# Patient Record
Sex: Male | Born: 1975 | Race: White | Hispanic: No | Marital: Married | Smoking: Current every day smoker
Health system: Southern US, Community
[De-identification: ages and names within clinical notes are randomized; demographics above are authoritative.]

## PROBLEM LIST (undated history)

## (undated) DIAGNOSIS — S82302A Unspecified fracture of lower end of left tibia, initial encounter for closed fracture: Secondary | ICD-10-CM

## (undated) DIAGNOSIS — F17200 Nicotine dependence, unspecified, uncomplicated: Secondary | ICD-10-CM

## (undated) DIAGNOSIS — J449 Chronic obstructive pulmonary disease, unspecified: Secondary | ICD-10-CM

## (undated) DIAGNOSIS — M1990 Unspecified osteoarthritis, unspecified site: Secondary | ICD-10-CM

## (undated) HISTORY — PX: TOOTH EXTRACTION: SUR596

---

## 2016-08-07 DIAGNOSIS — R0602 Shortness of breath: Secondary | ICD-10-CM | POA: Diagnosis not present

## 2016-08-07 DIAGNOSIS — M1990 Unspecified osteoarthritis, unspecified site: Secondary | ICD-10-CM | POA: Diagnosis not present

## 2016-08-07 DIAGNOSIS — G43909 Migraine, unspecified, not intractable, without status migrainosus: Secondary | ICD-10-CM | POA: Diagnosis not present

## 2016-08-13 DIAGNOSIS — G43909 Migraine, unspecified, not intractable, without status migrainosus: Secondary | ICD-10-CM | POA: Diagnosis not present

## 2016-08-13 DIAGNOSIS — R51 Headache: Secondary | ICD-10-CM | POA: Diagnosis not present

## 2016-08-14 DIAGNOSIS — M129 Arthropathy, unspecified: Secondary | ICD-10-CM | POA: Diagnosis not present

## 2016-08-14 DIAGNOSIS — R0789 Other chest pain: Secondary | ICD-10-CM | POA: Diagnosis not present

## 2016-08-14 DIAGNOSIS — M542 Cervicalgia: Secondary | ICD-10-CM | POA: Diagnosis not present

## 2016-08-14 DIAGNOSIS — M545 Low back pain: Secondary | ICD-10-CM | POA: Diagnosis not present

## 2016-08-14 DIAGNOSIS — Z0000 Encounter for general adult medical examination without abnormal findings: Secondary | ICD-10-CM | POA: Diagnosis not present

## 2016-08-14 DIAGNOSIS — R5383 Other fatigue: Secondary | ICD-10-CM | POA: Diagnosis not present

## 2016-08-14 DIAGNOSIS — Z131 Encounter for screening for diabetes mellitus: Secondary | ICD-10-CM | POA: Diagnosis not present

## 2016-08-14 DIAGNOSIS — G8929 Other chronic pain: Secondary | ICD-10-CM | POA: Diagnosis not present

## 2016-08-14 DIAGNOSIS — J3089 Other allergic rhinitis: Secondary | ICD-10-CM | POA: Diagnosis not present

## 2016-08-28 DIAGNOSIS — M47816 Spondylosis without myelopathy or radiculopathy, lumbar region: Secondary | ICD-10-CM | POA: Diagnosis not present

## 2016-08-28 DIAGNOSIS — Z79899 Other long term (current) drug therapy: Secondary | ICD-10-CM | POA: Diagnosis not present

## 2016-08-28 DIAGNOSIS — M546 Pain in thoracic spine: Secondary | ICD-10-CM | POA: Diagnosis not present

## 2016-08-28 DIAGNOSIS — G8929 Other chronic pain: Secondary | ICD-10-CM | POA: Diagnosis not present

## 2016-09-11 DIAGNOSIS — G8929 Other chronic pain: Secondary | ICD-10-CM | POA: Diagnosis not present

## 2016-09-11 DIAGNOSIS — F17200 Nicotine dependence, unspecified, uncomplicated: Secondary | ICD-10-CM | POA: Diagnosis not present

## 2016-09-11 DIAGNOSIS — Z79899 Other long term (current) drug therapy: Secondary | ICD-10-CM | POA: Diagnosis not present

## 2016-09-11 DIAGNOSIS — M545 Low back pain: Secondary | ICD-10-CM | POA: Diagnosis not present

## 2016-09-11 DIAGNOSIS — Z87891 Personal history of nicotine dependence: Secondary | ICD-10-CM | POA: Diagnosis not present

## 2016-09-22 DIAGNOSIS — R091 Pleurisy: Secondary | ICD-10-CM | POA: Diagnosis not present

## 2016-09-22 DIAGNOSIS — M940 Chondrocostal junction syndrome [Tietze]: Secondary | ICD-10-CM | POA: Diagnosis not present

## 2016-09-22 DIAGNOSIS — R05 Cough: Secondary | ICD-10-CM | POA: Diagnosis not present

## 2016-09-22 DIAGNOSIS — R109 Unspecified abdominal pain: Secondary | ICD-10-CM | POA: Diagnosis not present

## 2016-09-25 DIAGNOSIS — L03211 Cellulitis of face: Secondary | ICD-10-CM | POA: Diagnosis not present

## 2016-09-25 DIAGNOSIS — J0100 Acute maxillary sinusitis, unspecified: Secondary | ICD-10-CM | POA: Diagnosis not present

## 2016-09-25 DIAGNOSIS — K047 Periapical abscess without sinus: Secondary | ICD-10-CM | POA: Diagnosis not present

## 2016-09-26 DIAGNOSIS — K029 Dental caries, unspecified: Secondary | ICD-10-CM | POA: Diagnosis not present

## 2016-09-26 DIAGNOSIS — K05219 Aggressive periodontitis, localized, unspecified severity: Secondary | ICD-10-CM | POA: Diagnosis not present

## 2016-09-26 DIAGNOSIS — K05319 Chronic periodontitis, localized, unspecified severity: Secondary | ICD-10-CM | POA: Diagnosis not present

## 2016-10-13 DIAGNOSIS — F17200 Nicotine dependence, unspecified, uncomplicated: Secondary | ICD-10-CM | POA: Diagnosis not present

## 2016-10-13 DIAGNOSIS — Z87891 Personal history of nicotine dependence: Secondary | ICD-10-CM | POA: Diagnosis not present

## 2016-10-13 DIAGNOSIS — Z79899 Other long term (current) drug therapy: Secondary | ICD-10-CM | POA: Diagnosis not present

## 2016-10-13 DIAGNOSIS — G8929 Other chronic pain: Secondary | ICD-10-CM | POA: Diagnosis not present

## 2016-10-13 DIAGNOSIS — M545 Low back pain: Secondary | ICD-10-CM | POA: Diagnosis not present

## 2016-10-13 DIAGNOSIS — G43909 Migraine, unspecified, not intractable, without status migrainosus: Secondary | ICD-10-CM | POA: Diagnosis not present

## 2016-11-12 DIAGNOSIS — Z79899 Other long term (current) drug therapy: Secondary | ICD-10-CM | POA: Diagnosis not present

## 2016-11-12 DIAGNOSIS — F1120 Opioid dependence, uncomplicated: Secondary | ICD-10-CM | POA: Diagnosis not present

## 2016-11-12 DIAGNOSIS — M545 Low back pain: Secondary | ICD-10-CM | POA: Diagnosis not present

## 2016-11-12 DIAGNOSIS — G8929 Other chronic pain: Secondary | ICD-10-CM | POA: Diagnosis not present

## 2016-11-12 DIAGNOSIS — Z716 Tobacco abuse counseling: Secondary | ICD-10-CM | POA: Diagnosis not present

## 2016-11-25 DIAGNOSIS — F17200 Nicotine dependence, unspecified, uncomplicated: Secondary | ICD-10-CM | POA: Diagnosis not present

## 2016-11-25 DIAGNOSIS — N179 Acute kidney failure, unspecified: Secondary | ICD-10-CM | POA: Diagnosis not present

## 2016-11-25 DIAGNOSIS — E860 Dehydration: Secondary | ICD-10-CM | POA: Diagnosis not present

## 2016-11-27 ENCOUNTER — Inpatient Hospital Stay (HOSPITAL_COMMUNITY)
Admission: EM | Admit: 2016-11-27 | Discharge: 2016-12-02 | DRG: 493 | Disposition: A | Discharge status: Home / Self Care | Payer: Worker's Compensation | Attending: Orthopedic Surgery | Admitting: Orthopedic Surgery

## 2016-11-27 ENCOUNTER — Emergency Department (HOSPITAL_COMMUNITY): Payer: Worker's Compensation

## 2016-11-27 ENCOUNTER — Encounter (HOSPITAL_COMMUNITY): Payer: Self-pay | Admitting: Emergency Medicine

## 2016-11-27 ENCOUNTER — Emergency Department (HOSPITAL_COMMUNITY): Payer: Worker's Compensation | Admitting: Anesthesiology

## 2016-11-27 ENCOUNTER — Inpatient Hospital Stay (HOSPITAL_COMMUNITY): Payer: Worker's Compensation

## 2016-11-27 ENCOUNTER — Encounter (HOSPITAL_COMMUNITY)
Admission: EM | Disposition: A | Discharge status: Home / Self Care | Payer: Self-pay | Source: Home / Self Care | Attending: Orthopedic Surgery

## 2016-11-27 DIAGNOSIS — J449 Chronic obstructive pulmonary disease, unspecified: Secondary | ICD-10-CM | POA: Diagnosis present

## 2016-11-27 DIAGNOSIS — Z79891 Long term (current) use of opiate analgesic: Secondary | ICD-10-CM

## 2016-11-27 DIAGNOSIS — S72402A Unspecified fracture of lower end of left femur, initial encounter for closed fracture: Secondary | ICD-10-CM | POA: Diagnosis present

## 2016-11-27 DIAGNOSIS — S9702XA Crushing injury of left ankle, initial encounter: Secondary | ICD-10-CM | POA: Diagnosis present

## 2016-11-27 DIAGNOSIS — Z419 Encounter for procedure for purposes other than remedying health state, unspecified: Secondary | ICD-10-CM

## 2016-11-27 DIAGNOSIS — M546 Pain in thoracic spine: Secondary | ICD-10-CM | POA: Diagnosis not present

## 2016-11-27 DIAGNOSIS — S82872A Displaced pilon fracture of left tibia, initial encounter for closed fracture: Secondary | ICD-10-CM | POA: Diagnosis present

## 2016-11-27 DIAGNOSIS — D62 Acute posthemorrhagic anemia: Secondary | ICD-10-CM | POA: Diagnosis not present

## 2016-11-27 DIAGNOSIS — M1990 Unspecified osteoarthritis, unspecified site: Secondary | ICD-10-CM | POA: Diagnosis present

## 2016-11-27 DIAGNOSIS — Z79899 Other long term (current) drug therapy: Secondary | ICD-10-CM | POA: Diagnosis not present

## 2016-11-27 DIAGNOSIS — F17200 Nicotine dependence, unspecified, uncomplicated: Secondary | ICD-10-CM | POA: Diagnosis present

## 2016-11-27 DIAGNOSIS — F17210 Nicotine dependence, cigarettes, uncomplicated: Secondary | ICD-10-CM | POA: Diagnosis present

## 2016-11-27 DIAGNOSIS — Y990 Civilian activity done for income or pay: Secondary | ICD-10-CM

## 2016-11-27 DIAGNOSIS — S82831A Other fracture of upper and lower end of right fibula, initial encounter for closed fracture: Secondary | ICD-10-CM

## 2016-11-27 DIAGNOSIS — W228XXA Striking against or struck by other objects, initial encounter: Secondary | ICD-10-CM | POA: Diagnosis present

## 2016-11-27 DIAGNOSIS — M549 Dorsalgia, unspecified: Secondary | ICD-10-CM | POA: Diagnosis present

## 2016-11-27 DIAGNOSIS — S82302A Unspecified fracture of lower end of left tibia, initial encounter for closed fracture: Secondary | ICD-10-CM

## 2016-11-27 DIAGNOSIS — T148XXA Other injury of unspecified body region, initial encounter: Secondary | ICD-10-CM

## 2016-11-27 DIAGNOSIS — S82832A Other fracture of upper and lower end of left fibula, initial encounter for closed fracture: Secondary | ICD-10-CM | POA: Diagnosis present

## 2016-11-27 DIAGNOSIS — S82301A Unspecified fracture of lower end of right tibia, initial encounter for closed fracture: Secondary | ICD-10-CM

## 2016-11-27 HISTORY — PX: EXTERNAL FIXATION LEG: SHX1549

## 2016-11-27 HISTORY — DX: Nicotine dependence, unspecified, uncomplicated: F17.200

## 2016-11-27 HISTORY — DX: Unspecified osteoarthritis, unspecified site: M19.90

## 2016-11-27 HISTORY — DX: Unspecified fracture of lower end of left tibia, initial encounter for closed fracture: S82.302A

## 2016-11-27 HISTORY — DX: Chronic obstructive pulmonary disease, unspecified: J44.9

## 2016-11-27 LAB — I-STAT CHEM 8, ED
BUN: 17 mg/dL (ref 6–20)
CALCIUM ION: 1.13 mmol/L — AB (ref 1.15–1.40)
CREATININE: 0.6 mg/dL — AB (ref 0.61–1.24)
Chloride: 106 mmol/L (ref 101–111)
GLUCOSE: 96 mg/dL (ref 65–99)
HCT: 40 % (ref 39.0–52.0)
HEMOGLOBIN: 13.6 g/dL (ref 13.0–17.0)
Potassium: 4.8 mmol/L (ref 3.5–5.1)
Sodium: 138 mmol/L (ref 135–145)
TCO2: 24 mmol/L (ref 0–100)

## 2016-11-27 LAB — CBC WITH DIFFERENTIAL/PLATELET
Basophils Absolute: 0 10*3/uL (ref 0.0–0.1)
Basophils Relative: 0 %
EOS PCT: 1 %
Eosinophils Absolute: 0.1 10*3/uL (ref 0.0–0.7)
HEMATOCRIT: 40.7 % (ref 39.0–52.0)
Hemoglobin: 14 g/dL (ref 13.0–17.0)
LYMPHS ABS: 1.5 10*3/uL (ref 0.7–4.0)
LYMPHS PCT: 21 %
MCH: 30 pg (ref 26.0–34.0)
MCHC: 34.4 g/dL (ref 30.0–36.0)
MCV: 87.3 fL (ref 78.0–100.0)
MONO ABS: 0.3 10*3/uL (ref 0.1–1.0)
MONOS PCT: 5 %
NEUTROS ABS: 5.1 10*3/uL (ref 1.7–7.7)
NEUTROS PCT: 73 %
PLATELETS: 151 10*3/uL (ref 150–400)
RBC: 4.66 MIL/uL (ref 4.22–5.81)
RDW: 13.8 % (ref 11.5–15.5)
WBC: 6.9 10*3/uL (ref 4.0–10.5)

## 2016-11-27 LAB — CBC
HEMATOCRIT: 37.7 % — AB (ref 39.0–52.0)
HEMOGLOBIN: 12.9 g/dL — AB (ref 13.0–17.0)
MCH: 29.9 pg (ref 26.0–34.0)
MCHC: 34.2 g/dL (ref 30.0–36.0)
MCV: 87.3 fL (ref 78.0–100.0)
Platelets: 141 10*3/uL — ABNORMAL LOW (ref 150–400)
RBC: 4.32 MIL/uL (ref 4.22–5.81)
RDW: 13.6 % (ref 11.5–15.5)
WBC: 10.8 10*3/uL — ABNORMAL HIGH (ref 4.0–10.5)

## 2016-11-27 LAB — CREATININE, SERUM
Creatinine, Ser: 0.86 mg/dL (ref 0.61–1.24)
GFR calc Af Amer: >60 mL/min (ref >60)
GFR calc non Af Amer: >60 mL/min (ref >60)

## 2016-11-27 SURGERY — EXTERNAL FIXATION, LOWER EXTREMITY
Anesthesia: General | Laterality: Left

## 2016-11-27 MED ORDER — MIDAZOLAM HCL 2 MG/2ML IJ SOLN
INTRAMUSCULAR | Status: AC
  Filled 2016-11-27: qty 2

## 2016-11-27 MED ORDER — PROMETHAZINE HCL 25 MG/ML IJ SOLN
6.2500 mg | INTRAMUSCULAR | Status: DC | PRN
  Administered 2016-11-27: 6.25 mg via INTRAVENOUS

## 2016-11-27 MED ORDER — OXYCODONE HCL 5 MG PO TABS: 5.0000 mg | ORAL_TABLET | Freq: Once | ORAL | Status: DC | PRN

## 2016-11-27 MED ORDER — HYDROMORPHONE HCL 1 MG/ML IJ SOLN
0.5000 mg | INTRAMUSCULAR | Status: AC | PRN
  Administered 2016-11-27 (×4): 0.5 mg via INTRAVENOUS

## 2016-11-27 MED ORDER — MIDAZOLAM HCL 2 MG/2ML IJ SOLN
INTRAMUSCULAR | Status: AC
Start: 2016-11-27 — End: 2016-11-27
  Administered 2016-11-27: 2 mg via INTRAVENOUS
  Filled 2016-11-27: qty 2

## 2016-11-27 MED ORDER — HYDROMORPHONE HCL 1 MG/ML IJ SOLN
INTRAMUSCULAR | Status: AC
  Administered 2016-11-27: 0.5 mg via INTRAVENOUS
  Filled 2016-11-27: qty 1

## 2016-11-27 MED ORDER — ACETAMINOPHEN 10 MG/ML IV SOLN
1000.0000 mg | Freq: Four times a day (QID) | INTRAVENOUS | Status: DC
  Administered 2016-11-27 – 2016-11-28 (×3): 1000 mg via INTRAVENOUS
  Filled 2016-11-27 (×2): qty 100

## 2016-11-27 MED ORDER — HYDROMORPHONE HCL 1 MG/ML IJ SOLN
1.0000 mg | Freq: Once | INTRAMUSCULAR | Status: AC
  Administered 2016-11-27: 1 mg via INTRAVENOUS
  Filled 2016-11-27: qty 1

## 2016-11-27 MED ORDER — PROPOFOL 10 MG/ML IV BOLUS
INTRAVENOUS | Status: AC
  Filled 2016-11-27: qty 20

## 2016-11-27 MED ORDER — SUCCINYLCHOLINE CHLORIDE 200 MG/10ML IV SOSY
PREFILLED_SYRINGE | INTRAVENOUS | Status: AC
  Filled 2016-11-27: qty 10

## 2016-11-27 MED ORDER — MIDAZOLAM HCL 2 MG/2ML IJ SOLN
0.2500 mg | Freq: Once | INTRAMUSCULAR | Status: AC
  Administered 2016-11-27: 2 mg via INTRAVENOUS

## 2016-11-27 MED ORDER — FENTANYL CITRATE (PF) 100 MCG/2ML IJ SOLN
INTRAMUSCULAR | Status: DC | PRN
  Administered 2016-11-27: 50 ug via INTRAVENOUS
  Administered 2016-11-27: 150 ug via INTRAVENOUS
  Administered 2016-11-27 (×2): 50 ug via INTRAVENOUS

## 2016-11-27 MED ORDER — ENOXAPARIN SODIUM 40 MG/0.4ML ~~LOC~~ SOLN
40.0000 mg | SUBCUTANEOUS | Status: DC
  Administered 2016-11-28 – 2016-12-02 (×5): 40 mg via SUBCUTANEOUS
  Filled 2016-11-27 (×5): qty 0.4

## 2016-11-27 MED ORDER — OXYCODONE HCL 5 MG/5ML PO SOLN: 5.0000 mg | Freq: Once | ORAL | Status: DC | PRN

## 2016-11-27 MED ORDER — FENTANYL CITRATE (PF) 100 MCG/2ML IJ SOLN
INTRAMUSCULAR | Status: AC
  Filled 2016-11-27: qty 2

## 2016-11-27 MED ORDER — HYDROMORPHONE HCL 1 MG/ML IJ SOLN
1.0000 mg | INTRAMUSCULAR | Status: DC | PRN
Start: 2016-11-27 — End: 2016-11-27
  Administered 2016-11-27: 1 mg via INTRAVENOUS
  Filled 2016-11-27: qty 1

## 2016-11-27 MED ORDER — PROMETHAZINE HCL 25 MG/ML IJ SOLN
INTRAMUSCULAR | Status: AC
  Administered 2016-11-27: 6.25 mg via INTRAVENOUS
  Filled 2016-11-27: qty 1

## 2016-11-27 MED ORDER — LIDOCAINE HCL (CARDIAC) 20 MG/ML IV SOLN
INTRAVENOUS | Status: DC | PRN
  Administered 2016-11-27: 100 mg via INTRAVENOUS

## 2016-11-27 MED ORDER — KETAMINE HCL-SODIUM CHLORIDE 100-0.9 MG/10ML-% IV SOSY
PREFILLED_SYRINGE | INTRAVENOUS | Status: AC
  Filled 2016-11-27: qty 10

## 2016-11-27 MED ORDER — ONDANSETRON HCL 4 MG/2ML IJ SOLN
INTRAMUSCULAR | Status: AC
  Filled 2016-11-27: qty 2

## 2016-11-27 MED ORDER — ONDANSETRON HCL 4 MG/2ML IJ SOLN
INTRAMUSCULAR | Status: DC | PRN
  Administered 2016-11-27: 4 mg via INTRAVENOUS

## 2016-11-27 MED ORDER — KETAMINE HCL 10 MG/ML IJ SOLN
INTRAMUSCULAR | Status: DC | PRN
  Administered 2016-11-27: 50 mg via INTRAVENOUS

## 2016-11-27 MED ORDER — METHOCARBAMOL 500 MG PO TABS
ORAL_TABLET | ORAL | Status: AC
  Administered 2016-11-27: 1000 mg via ORAL
  Filled 2016-11-27: qty 1

## 2016-11-27 MED ORDER — FENTANYL CITRATE (PF) 250 MCG/5ML IJ SOLN
INTRAMUSCULAR | Status: AC
  Filled 2016-11-27: qty 5

## 2016-11-27 MED ORDER — ACETAMINOPHEN 10 MG/ML IV SOLN
INTRAVENOUS | Status: AC
  Administered 2016-11-27: 1000 mg via INTRAVENOUS
  Filled 2016-11-27: qty 100

## 2016-11-27 MED ORDER — HYDROMORPHONE HCL 1 MG/ML IJ SOLN
0.5000 mg | INTRAMUSCULAR | Status: DC | PRN
  Administered 2016-11-27 – 2016-11-28 (×7): 1 mg via INTRAVENOUS
  Filled 2016-11-27 (×7): qty 1

## 2016-11-27 MED ORDER — SUGAMMADEX SODIUM 200 MG/2ML IV SOLN
INTRAVENOUS | Status: AC
  Filled 2016-11-27: qty 2

## 2016-11-27 MED ORDER — CEFAZOLIN SODIUM-DEXTROSE 1-4 GM/50ML-% IV SOLN
1.0000 g | Freq: Four times a day (QID) | INTRAVENOUS | Status: AC
  Administered 2016-11-27 – 2016-11-28 (×3): 1 g via INTRAVENOUS
  Filled 2016-11-27 (×3): qty 50

## 2016-11-27 MED ORDER — METHOCARBAMOL 1000 MG/10ML IJ SOLN
1000.0000 mg | Freq: Four times a day (QID) | INTRAVENOUS | Status: DC
  Filled 2016-11-27 (×22): qty 10

## 2016-11-27 MED ORDER — ONDANSETRON HCL 4 MG PO TABS: 4.0000 mg | ORAL_TABLET | Freq: Four times a day (QID) | ORAL | Status: DC | PRN

## 2016-11-27 MED ORDER — METOCLOPRAMIDE HCL 5 MG PO TABS: 5.0000 mg | ORAL_TABLET | Freq: Three times a day (TID) | ORAL | Status: DC | PRN

## 2016-11-27 MED ORDER — MEPERIDINE HCL 25 MG/ML IJ SOLN
6.2500 mg | INTRAMUSCULAR | Status: DC | PRN
Start: 2016-11-27 — End: 2016-11-27

## 2016-11-27 MED ORDER — METOCLOPRAMIDE HCL 5 MG/ML IJ SOLN: 5.0000 mg | Freq: Three times a day (TID) | INTRAMUSCULAR | Status: DC | PRN

## 2016-11-27 MED ORDER — MIDAZOLAM HCL 5 MG/5ML IJ SOLN
INTRAMUSCULAR | Status: DC | PRN
  Administered 2016-11-27: 2 mg via INTRAVENOUS

## 2016-11-27 MED ORDER — 0.9 % SODIUM CHLORIDE (POUR BTL) OPTIME
TOPICAL | Status: DC | PRN
  Administered 2016-11-27: 1000 mL

## 2016-11-27 MED ORDER — ROCURONIUM BROMIDE 100 MG/10ML IV SOLN
INTRAVENOUS | Status: DC | PRN
  Administered 2016-11-27 (×2): 10 mg via INTRAVENOUS
  Administered 2016-11-27: 50 mg via INTRAVENOUS

## 2016-11-27 MED ORDER — CEFAZOLIN SODIUM-DEXTROSE 2-3 GM-% IV SOLR
INTRAVENOUS | Status: DC | PRN
  Administered 2016-11-27: 2 g via INTRAVENOUS

## 2016-11-27 MED ORDER — OXYCODONE HCL 5 MG PO TABS
5.0000 mg | ORAL_TABLET | ORAL | Status: DC | PRN
  Administered 2016-11-27 (×2): 15 mg via ORAL
  Administered 2016-11-27: 10 mg via ORAL
  Administered 2016-11-28 (×3): 15 mg via ORAL
  Filled 2016-11-27 (×6): qty 3

## 2016-11-27 MED ORDER — ONDANSETRON HCL 4 MG/2ML IJ SOLN: 4.0000 mg | Freq: Four times a day (QID) | INTRAMUSCULAR | Status: DC | PRN

## 2016-11-27 MED ORDER — METHOCARBAMOL 500 MG PO TABS
1000.0000 mg | ORAL_TABLET | Freq: Four times a day (QID) | ORAL | Status: DC
  Administered 2016-11-27 – 2016-12-02 (×21): 1000 mg via ORAL
  Filled 2016-11-27 (×20): qty 2

## 2016-11-27 MED ORDER — PROPOFOL 10 MG/ML IV BOLUS
INTRAVENOUS | Status: DC | PRN
  Administered 2016-11-27: 200 mg via INTRAVENOUS
  Administered 2016-11-27 (×2): 50 mg via INTRAVENOUS

## 2016-11-27 MED ORDER — GABAPENTIN 300 MG PO CAPS
300.0000 mg | ORAL_CAPSULE | Freq: Three times a day (TID) | ORAL | Status: DC
  Administered 2016-11-28 – 2016-12-01 (×9): 300 mg via ORAL
  Filled 2016-11-27 (×10): qty 1

## 2016-11-27 MED ORDER — SUCCINYLCHOLINE CHLORIDE 20 MG/ML IJ SOLN
INTRAMUSCULAR | Status: DC | PRN
  Administered 2016-11-27: 100 mg via INTRAVENOUS

## 2016-11-27 MED ORDER — ALBUTEROL SULFATE (2.5 MG/3ML) 0.083% IN NEBU: 2.5000 mg | INHALATION_SOLUTION | Freq: Four times a day (QID) | RESPIRATORY_TRACT | Status: DC | PRN

## 2016-11-27 MED ORDER — METHOCARBAMOL 500 MG PO TABS
ORAL_TABLET | ORAL | Status: AC
  Filled 2016-11-27: qty 1

## 2016-11-27 MED ORDER — LIDOCAINE 2% (20 MG/ML) 5 ML SYRINGE
INTRAMUSCULAR | Status: AC
  Filled 2016-11-27: qty 5

## 2016-11-27 MED ORDER — LACTATED RINGERS IV SOLN
INTRAVENOUS | Status: DC
  Administered 2016-11-27 (×3): via INTRAVENOUS

## 2016-11-27 MED ORDER — OXYCODONE HCL 5 MG PO TABS
ORAL_TABLET | ORAL | Status: AC
  Administered 2016-11-27: 10 mg via ORAL
  Filled 2016-11-27: qty 2

## 2016-11-27 MED ORDER — POTASSIUM CHLORIDE IN NACL 20-0.9 MEQ/L-% IV SOLN
INTRAVENOUS | Status: DC
  Administered 2016-11-28: 03:00:00 via INTRAVENOUS
  Filled 2016-11-27 (×2): qty 1000

## 2016-11-27 MED ORDER — SUGAMMADEX SODIUM 200 MG/2ML IV SOLN
INTRAVENOUS | Status: DC | PRN
  Administered 2016-11-27: 200 mg via INTRAVENOUS

## 2016-11-27 MED ORDER — HYDROMORPHONE HCL 1 MG/ML IJ SOLN
0.2500 mg | INTRAMUSCULAR | Status: DC | PRN
  Administered 2016-11-27 (×4): 0.5 mg via INTRAVENOUS

## 2016-11-27 MED ORDER — ROCURONIUM BROMIDE 10 MG/ML (PF) SYRINGE
PREFILLED_SYRINGE | INTRAVENOUS | Status: AC
  Filled 2016-11-27: qty 5

## 2016-11-27 SURGICAL SUPPLY — 87 items
BANDAGE ACE 4X5 VEL STRL LF (GAUZE/BANDAGES/DRESSINGS) ×2 IMPLANT
BANDAGE ACE 6X5 VEL STRL LF (GAUZE/BANDAGES/DRESSINGS) ×2 IMPLANT
BANDAGE ESMARK 6X9 LF (GAUZE/BANDAGES/DRESSINGS) ×1 IMPLANT
BAR GLASS FIBER EXFX 11X150 (EXFIX) ×4 IMPLANT
BAR GLASS FIBER EXFX 11X350 (EXFIX) ×4 IMPLANT
BIT DRILL 3.5 (BIT) ×1
BIT DRILL 3.5MM (BIT) ×1 IMPLANT
BIT DRILL WIN 4.0 (BIT) ×2 IMPLANT
BNDG COHESIVE 2X5 WHT NS (GAUZE/BANDAGES/DRESSINGS) ×2 IMPLANT
BNDG COHESIVE 3X5 WHT NS (GAUZE/BANDAGES/DRESSINGS) ×2 IMPLANT
BNDG COHESIVE 4X5 TAN STRL (GAUZE/BANDAGES/DRESSINGS) ×2 IMPLANT
BNDG COHESIVE 6X5 TAN STRL LF (GAUZE/BANDAGES/DRESSINGS) IMPLANT
BNDG ESMARK 6X9 LF (GAUZE/BANDAGES/DRESSINGS) ×2
BNDG GAUZE ELAST 4 BULKY (GAUZE/BANDAGES/DRESSINGS) ×4 IMPLANT
BRUSH SCRUB SURG 4.25 DISP (MISCELLANEOUS) ×6 IMPLANT
CANISTER WOUND CARE 500ML ATS (WOUND CARE) IMPLANT
CLAMP BLUE BAR TO BAR (EXFIX) ×4 IMPLANT
CLAMP BLUE BAR TO PIN (EXFIX) ×10 IMPLANT
COVER SURGICAL LIGHT HANDLE (MISCELLANEOUS) ×2 IMPLANT
CUFF TOURNIQUET SINGLE 18IN (TOURNIQUET CUFF) IMPLANT
CUFF TOURNIQUET SINGLE 24IN (TOURNIQUET CUFF) IMPLANT
CUFF TOURNIQUET SINGLE 34IN LL (TOURNIQUET CUFF) IMPLANT
DRAPE C-ARM 42X72 X-RAY (DRAPES) ×2 IMPLANT
DRAPE C-ARMOR (DRAPES) ×2 IMPLANT
DRAPE INCISE IOBAN 66X45 STRL (DRAPES) ×2 IMPLANT
DRAPE ORTHO SPLIT 77X108 STRL (DRAPES) ×2
DRAPE SURG ORHT 6 SPLT 77X108 (DRAPES) ×2 IMPLANT
DRAPE U-SHAPE 47X51 STRL (DRAPES) ×2 IMPLANT
DRILL BIT 3.5MM (BIT) ×1
DRSG ADAPTIC 3X8 NADH LF (GAUZE/BANDAGES/DRESSINGS) ×2 IMPLANT
DRSG MEPILEX BORDER 4X8 (GAUZE/BANDAGES/DRESSINGS) ×2 IMPLANT
DRSG MEPITEL 4X7.2 (GAUZE/BANDAGES/DRESSINGS) IMPLANT
DRSG PAD ABDOMINAL 8X10 ST (GAUZE/BANDAGES/DRESSINGS) ×2 IMPLANT
DRSG VAC ATS LRG SENSATRAC (GAUZE/BANDAGES/DRESSINGS) IMPLANT
DRSG VAC ATS MED SENSATRAC (GAUZE/BANDAGES/DRESSINGS) IMPLANT
DRSG VAC ATS SM SENSATRAC (GAUZE/BANDAGES/DRESSINGS) IMPLANT
ELECT REM PT RETURN 9FT ADLT (ELECTROSURGICAL) ×2
ELECTRODE REM PT RTRN 9FT ADLT (ELECTROSURGICAL) ×1 IMPLANT
EVACUATOR 1/8 PVC DRAIN (DRAIN) IMPLANT
GAUZE SPONGE 4X4 12PLY STRL (GAUZE/BANDAGES/DRESSINGS) ×2 IMPLANT
GAUZE SPONGE 4X4 12PLY STRL LF (GAUZE/BANDAGES/DRESSINGS) ×2 IMPLANT
GLOVE BIO SURGEON STRL SZ7.5 (GLOVE) ×2 IMPLANT
GLOVE BIO SURGEON STRL SZ8 (GLOVE) ×2 IMPLANT
GLOVE BIOGEL PI IND STRL 7.5 (GLOVE) ×1 IMPLANT
GLOVE BIOGEL PI IND STRL 8 (GLOVE) ×1 IMPLANT
GLOVE BIOGEL PI INDICATOR 7.5 (GLOVE) ×1
GLOVE BIOGEL PI INDICATOR 8 (GLOVE) ×1
GLOVE XGUARD RR 2 7.5 (GLOVE) ×1 IMPLANT
GLOVE XGUARD RR2 7.5 (GLOVE) ×1
GOWN STRL REUS W/ TWL LRG LVL3 (GOWN DISPOSABLE) ×2 IMPLANT
GOWN STRL REUS W/ TWL XL LVL3 (GOWN DISPOSABLE) ×1 IMPLANT
GOWN STRL REUS W/TWL LRG LVL3 (GOWN DISPOSABLE) ×2
GOWN STRL REUS W/TWL XL LVL3 (GOWN DISPOSABLE) ×1
HALF PIN 3MM (EXFIX) ×2 IMPLANT
KIT BASIN OR (CUSTOM PROCEDURE TRAY) ×2 IMPLANT
KIT ROOM TURNOVER OR (KITS) ×2 IMPLANT
MANIFOLD NEPTUNE II (INSTRUMENTS) IMPLANT
NAIL FLEX WIN 3.0MM (Nail) ×2 IMPLANT
NAIL FLEXIBLE WIN 3.5MM (Nail) ×2 IMPLANT
NEEDLE 22X1 1/2 (OR ONLY) (NEEDLE) IMPLANT
NS IRRIG 1000ML POUR BTL (IV SOLUTION) ×2 IMPLANT
PACK GENERAL/GYN (CUSTOM PROCEDURE TRAY) IMPLANT
PACK ORTHO EXTREMITY (CUSTOM PROCEDURE TRAY) ×2 IMPLANT
PAD ARMBOARD 7.5X6 YLW CONV (MISCELLANEOUS) ×4 IMPLANT
PAD CAST 3X4 CTTN HI CHSV (CAST SUPPLIES) ×1 IMPLANT
PADDING CAST COTTON 3X4 STRL (CAST SUPPLIES) ×1
PADDING CAST COTTON 6X4 STRL (CAST SUPPLIES) ×4 IMPLANT
PIN 3MM (EXFIX) ×2 IMPLANT
PIN CLAMP 2BAR 75MM BLUE (EXFIX) ×2 IMPLANT
PIN HALF YELLOW 5X160X35 (EXFIX) ×4 IMPLANT
PIN TRANSFIXING 5.0 (EXFIX) ×2 IMPLANT
SCOTCHCAST PLUS 2X4 WHITE (CAST SUPPLIES) ×2 IMPLANT
SET MONITOR QUICK PRESSURE (MISCELLANEOUS) IMPLANT
SPONGE LAP 18X18 X RAY DECT (DISPOSABLE) ×2 IMPLANT
STAPLER VISISTAT 35W (STAPLE) IMPLANT
STOCKINETTE IMPERVIOUS 9X36 MD (GAUZE/BANDAGES/DRESSINGS) IMPLANT
STOCKINETTE IMPERVIOUS LG (DRAPES) IMPLANT
STRIP CLOSURE SKIN 1/2X4 (GAUZE/BANDAGES/DRESSINGS) IMPLANT
SUT ETHILON 2 0 FSLX (SUTURE) IMPLANT
SUT ETHILON 3 0 PS 1 (SUTURE) ×2 IMPLANT
SUT VIC AB 0 CTB1 27 (SUTURE) IMPLANT
SUT VIC AB 2-0 CT3 27 (SUTURE) IMPLANT
SUT VIC AB 2-0 CTB1 (SUTURE) IMPLANT
TOWEL OR 17X24 6PK STRL BLUE (TOWEL DISPOSABLE) ×2 IMPLANT
TOWEL OR 17X26 10 PK STRL BLUE (TOWEL DISPOSABLE) ×4 IMPLANT
UNDERPAD 30X30 (UNDERPADS AND DIAPERS) ×2 IMPLANT
WATER STERILE IRR 1000ML POUR (IV SOLUTION) ×2 IMPLANT

## 2016-11-27 NOTE — Anesthesia Preprocedure Evaluation (Addendum)
Anesthesia Evaluation  Patient identified by MRN, date of birth, ID band Patient awake    Reviewed: Allergy & Precautions, NPO status , Patient's Chart, lab work & pertinent test results  Airway Mallampati: II  TM Distance: >3 FB Neck ROM: Full    Dental no notable dental hx. (+) Poor Dentition, Missing, Chipped   Pulmonary COPD, Current Smoker,    Pulmonary exam normal breath sounds clear to auscultation       Cardiovascular negative cardio ROS Normal cardiovascular exam Rhythm:Regular Rate:Normal     Neuro/Psych negative neurological ROS  negative psych ROS   GI/Hepatic negative GI ROS, Neg liver ROS,   Endo/Other  negative endocrine ROS  Renal/GU negative Renal ROS     Musculoskeletal negative musculoskeletal ROS (+)   Abdominal   Peds  Hematology negative hematology ROS (+)   Anesthesia Other Findings   Reproductive/Obstetrics negative OB ROS                             Anesthesia Physical Anesthesia Plan  ASA: II  Anesthesia Plan: General   Post-op Pain Management:    Induction: Intravenous  PONV Risk Score and Plan: 2 and Ondansetron and Dexamethasone  Airway Management Planned: Oral ETT  Additional Equipment:   Intra-op Plan:   Post-operative Plan: Extubation in OR  Informed Consent: I have reviewed the patients History and Physical, chart, labs and discussed the procedure including the risks, benefits and alternatives for the proposed anesthesia with the patient or authorized representative who has indicated his/her understanding and acceptance.   Dental advisory given  Plan Discussed with: CRNA  Anesthesia Plan Comments:        Anesthesia Quick Evaluation

## 2016-11-27 NOTE — ED Notes (Signed)
ED Provider at bedside. 

## 2016-11-27 NOTE — Anesthesia Postprocedure Evaluation (Signed)
Anesthesia Post Note  Patient: Oscar Martinez  Procedure(s) Performed: Procedure(s) (LRB): EXTERNAL FIXATION POSSIBLE REPAIR OF LEFT TIBIA AND FIBULA FRACTURES (Left)     Patient location during evaluation: PACU Anesthesia Type: General Level of consciousness: awake and alert and oriented Pain management: pain level controlled Vital Signs Assessment: post-procedure vital signs reviewed and stable Respiratory status: spontaneous breathing, nonlabored ventilation and respiratory function stable Cardiovascular status: blood pressure returned to baseline and stable Postop Assessment: no signs of nausea or vomiting Anesthetic complications: no    Last Vitals:  Vitals:   11/27/16 1215 11/27/16 1230  BP: 106/83 114/80  Pulse: 81 79  Resp: 12 15  Temp:    SpO2: 99% 100%    Last Pain:  Vitals:   11/27/16 1231  TempSrc:   PainSc: 8                  Ettore Trebilcock A.

## 2016-11-27 NOTE — Anesthesia Procedure Notes (Signed)
Procedure Name: Intubation Date/Time: 11/27/2016 1:48 PM Performed by: Fransisca KaufmannMEYER, Skya Mccullum E Pre-anesthesia Checklist: Patient identified, Emergency Drugs available, Suction available and Patient being monitored Patient Re-evaluated:Patient Re-evaluated prior to induction Oxygen Delivery Method: Circle System Utilized Preoxygenation: Pre-oxygenation with 100% oxygen Induction Type: IV induction and Rapid sequence Ventilation: Mask ventilation without difficulty Laryngoscope Size: Miller and 3 Grade View: Grade I Tube type: Oral Tube size: 7.5 mm Number of attempts: 1 Airway Equipment and Method: Stylet and Oral airway Placement Confirmation: ETT inserted through vocal cords under direct vision,  positive ETCO2 and breath sounds checked- equal and bilateral Secured at: 23 cm Tube secured with: Tape Dental Injury: Teeth and Oropharynx as per pre-operative assessment

## 2016-11-27 NOTE — ED Notes (Signed)
Dr.Handy at bedside 

## 2016-11-27 NOTE — H&P (Signed)
[facility] H&P/Consult     Patient ID: Oscar Martinez MRN: [id number] DOB/AGE: 41-Dec-1977 41 y.o.  Chief Complaint: Left tibia and fibula fracture HPI: Oscar SomCalvin Martinez is an 41 y.o. male.who has been on Norco 7.5 for two months for back and leg arthritis from Dr[facility] K[facility]ernando [facility], two days ago IVF at [facility] for dehydration with elevated creatinine, and after taking yesterday off went to work today where a full size log rolled off the truck, hitting him the head hard enough (per his report) to actually drive his leg into the ground and fracture his ankle. Denies any direct impact of log to leg. Denies numbness or other injury. Denies neckk and back pain but states that they are starting to feel a little more sore now.  CT scan negative for skull fracture, cervical fracture, or soft tissue swelling.   Past Medical History:  Diagnosis Date  . Arthritis   . COPD (chronic obstructive pulmonary disease) (HCC)     History reviewed. No pertinent surgical history.  History reviewed. No pertinent family history. Social History:  reports that he has been smoking.  He has been smoking about 1.50 packs per day. He has never used smokeless tobacco. He reports that he does not drink alcohol or use drugs. Drinks [employer] and [employer].  Allergies: No Known Allergies   (Not in a hospital admission)  Results for orders placed or performed during the hospital encounter of 11/27/16 (from the past 48 hour(s))  CBC with Differential/Platelet     Status: None   Collection Time: 11/27/16 10:15 AM  Result Value Ref Range   WBC 6.9 4.0 - 10.5 K/uL   RBC 4.66 4.22 - 5.81 MIL/uL   Hemoglobin 14.0 13.0 - 17.0 g/dL   HCT 40.940.7 81.139.0 - 91.452.0 %   MCV 87.3 78.0 - 100.0 fL   MCH 30.0 26.0 - 34.0 pg   MCHC 34.4 30.0 - 36.0 g/dL   RDW 78.213.8 95.611.5 - 21.315.5 %   Platelets 151 150 - 400 K/uL   Neutrophils Relative % 73 %   Neutro Abs 5.1 1.7 - 7.7 K/uL   Lymphocytes  Relative 21 %   Lymphs Abs 1.5 0.7 - 4.0 K/uL   Monocytes Relative 5 %   Monocytes Absolute 0.3 0.1 - 1.0 K/uL   Eosinophils Relative 1 %   Eosinophils Absolute 0.1 0.0 - 0.7 K/uL   Basophils Relative 0 %   Basophils Absolute 0.0 0.0 - 0.1 K/uL  I-stat chem 8, ed     Status: Abnormal   Collection Time: 11/27/16 11:49 AM  Result Value Ref Range   Sodium 138 135 - 145 mmol/L   Potassium 4.8 3.5 - 5.1 mmol/L   Chloride 106 101 - 111 mmol/L   BUN 17 6 - 20 mg/dL   Creatinine, Ser 0.860.60 (L) 0.61 - 1.24 mg/dL   Glucose, Bld 96 65 - 99 mg/dL   Calcium, Ion 5.781.13 (L) 1.15 - 1.40 mmol/L   TCO2 24 0 - 100 mmol/L   Hemoglobin 13.6 13.0 - 17.0 g/dL   HCT 46.940.0 62.939.0 - 52.852.0 %   Dg Ankle Complete Left  Result Date: 11/27/2016 CLINICAL DATA:  Left ankle injury today EXAM: LEFT ANKLE COMPLETE - 3+ VIEW COMPARISON:  None. FINDINGS: Comminuted distal left tibia fracture involving the distal metaphysis and apparently extending distally to the articular surface at the posterior malleolus, with 8 mm lateral displacement of the dominant distal fracture fragment  and mild apex anterior angulation. Comminuted non articular fracture of the distal metadiaphysis in the left fibula, with one shaft's with lateral displacement of the dominant distal fracture fragment and mild apex anterior angulation. No additional fractures. No subluxation. No suspicious focal osseous lesion. No radiopaque foreign body. IMPRESSION: 1. Comminuted displaced distal left tibia fracture as detailed, noting probable intra-articular extension at the posterior malleolus. 2. Comminuted non articular distal left fibula fracture as detailed. Electronically Signed   By: Delbert Phenix M.D.   On: 11/27/2016 10:44   Ct Head Wo Contrast  Result Date: 11/27/2016 CLINICAL DATA:  Posterior neck pain and headache after being hit in the head with a log. EXAM: CT HEAD WITHOUT CONTRAST CT CERVICAL SPINE WITHOUT CONTRAST TECHNIQUE: Multidetector CT imaging of the  head and cervical spine was performed following the standard protocol without intravenous contrast. Multiplanar CT image reconstructions of the cervical spine were also generated. COMPARISON:  None. FINDINGS: CT HEAD FINDINGS Brain: No evidence of acute infarction, hemorrhage, hydrocephalus, extra-axial collection or mass lesion/mass effect. Vascular: No hyperdense vessel or unexpected calcification. Skull: Normal. Negative for fracture or focal lesion. Sinuses/Orbits: Moderate mucosal thickening in the right maxillary sinus. Small mucous retention cysts in the bilateral sphenoid sinuses. The remaining paranasal sinuses and mastoid air cells are clear. The orbits are unremarkable. Other: None. CT CERVICAL SPINE FINDINGS Alignment: Normal. Skull base and vertebrae: No acute fracture. No primary bone lesion or focal pathologic process. Soft tissues and spinal canal: No prevertebral fluid or swelling. No visible canal hematoma. Disc levels:  Scattered minimal degenerative changes. Upper chest: Negative. Other: None. IMPRESSION: 1.  No acute intracranial abnormality. 2.  No acute cervical spine fracture. Electronically Signed   By: Obie Dredge M.D.   On: 11/27/2016 11:06   Ct Cervical Spine Wo Contrast  Result Date: 11/27/2016 CLINICAL DATA:  Posterior neck pain and headache after being hit in the head with a log. EXAM: CT HEAD WITHOUT CONTRAST CT CERVICAL SPINE WITHOUT CONTRAST TECHNIQUE: Multidetector CT imaging of the head and cervical spine was performed following the standard protocol without intravenous contrast. Multiplanar CT image reconstructions of the cervical spine were also generated. COMPARISON:  None. FINDINGS: CT HEAD FINDINGS Brain: No evidence of acute infarction, hemorrhage, hydrocephalus, extra-axial collection or mass lesion/mass effect. Vascular: No hyperdense vessel or unexpected calcification. Skull: Normal. Negative for fracture or focal lesion. Sinuses/Orbits: Moderate mucosal thickening  in the right maxillary sinus. Small mucous retention cysts in the bilateral sphenoid sinuses. The remaining paranasal sinuses and mastoid air cells are clear. The orbits are unremarkable. Other: None. CT CERVICAL SPINE FINDINGS Alignment: Normal. Skull base and vertebrae: No acute fracture. No primary bone lesion or focal pathologic process. Soft tissues and spinal canal: No prevertebral fluid or swelling. No visible canal hematoma. Disc levels:  Scattered minimal degenerative changes. Upper chest: Negative. Other: None. IMPRESSION: 1.  No acute intracranial abnormality. 2.  No acute cervical spine fracture. Electronically Signed   By: Obie Dredge M.D.   On: 11/27/2016 11:06    ROS No recent fever, bleeding abnormalities, urologic dysfunction, GI problems, or weight gain.  Blood pressure 106/83, pulse 81, temperature 98.1 F (36.7 C), temperature source Oral, resp. rate 12, height 5\' 9"  (1.753 m), weight 92.1 kg (203 lb), SpO2 99 %. Physical Exam NCAT but tender\ FROM neck RRR Wheezing Soft, NT, ND LLE No traumatic wounds but small area of ecchymosis/ abrasion, no rash  Tender and swollen  No knee or ankle effusion  Sens DPN,  SPN, TN intact  Motor EHL, ext, flex, evers 5/5  DP 2+  Compartments soft RLE No traumatic wounds, ecchymosis, or rash  Nontender  No knee or ankle effusion  Knee stable to varus/ valgus and anterior/posterior stress  Sens DPN, SPN, TN intact  Motor EHL, ext, flex, evers 5/5  DP 2+, PT 2+, No significant edema  Assessment/Plan Left distal tibia and fibula fractures 1. Spanning external fixation, possible rodding of fibula 2. Subsequent CT scan and probable plating 3. Pain control and soft tissue treatment  I discussed with the patient and his wife the risks and benefits of surgery, including the possibility of infection, nerve injury, vessel injury, wound breakdown, arthritis, symptomatic hardware, DVT/ PE, loss of motion, malunion, nonunion, and need for  further surgery among others.  We also specifically discussed the need to stage surgery because of the elevated risk of soft tissue breakdown that could lead to amputation.  They acknowledged these risks and wished to proceed.   Myrene Galas, MD [facility] [phone] (p)  11/27/2016, 12:41 PM

## 2016-11-27 NOTE — Transfer of Care (Signed)
Immediate Anesthesia Transfer of Care Note  Patient: Oscar Martinez  Procedure(s) Performed: Procedure(s): EXTERNAL FIXATION POSSIBLE REPAIR OF LEFT TIBIA AND FIBULA FRACTURES (Left)  Patient Location: PACU  Anesthesia Type:General  Level of Consciousness: awake and alert   Airway & Oxygen Therapy: Patient Spontanous Breathing  Post-op Assessment: Report given to RN, Post -op Vital signs reviewed and stable and Patient moving all extremities X 4  Post vital signs: Reviewed and stable  Last Vitals:  Vitals:   11/27/16 1215 11/27/16 1230  BP: 106/83 114/80  Pulse: 81 79  Resp: 12 15  Temp:    SpO2: 99% 100%    Last Pain:  Vitals:   11/27/16 1231  TempSrc:   PainSc: 8          Complications: No apparent anesthesia complications

## 2016-11-27 NOTE — ED Provider Notes (Signed)
[facility]-EMERGENCY DEPT Provider Note   CSN: [id number] Arrival date & time: 11/27/16  4098     History   Chief Complaint Chief Complaint  Patient presents with  . Head Injury  . Ankle Pain    HPI Oscar Martinez is a 41 y.o. male.  HPI   41 year old male with history of COPD, arthritis brought here via ambulance from an accident this morning. Patient was transporting log on a truck.  He was in the process of unbuckling the log when one of them fell, hits his head causing him to fall into the ground.  The impact of the force snapped his L ankle as he fell.  Pt report acute onset of sharp severe pain to L ankle, and felt a pop.  He was then able to stand up afterwards. Unable to bear weight. Denies any loss of consciousness. Aside from mild tenderness to the top of his head he denies any significant headache, neck pain, confusion, or pain anywhere else aside from his left ankle. He did receive 100 g of fentanyl via EMS prior to arrival which provide some relief. He is up-to-date with tetanus. Last meal was last night. He does take chronic pain medication and did take his Vicodin this morning. He denies any significant low back pain. No report of numbness.   Past Medical History:  Diagnosis Date  . Arthritis   . COPD (chronic obstructive pulmonary disease) (HCC)     There are no active problems to display for this patient.   History reviewed. No pertinent surgical history.     Home Medications    Prior to Admission medications   Not on File    Family History No family history on file.  Social History Social History  Substance Use Topics  . Smoking status: Current Every Day Smoker    Packs/day: 1.50  . Smokeless tobacco: Never Used  . Alcohol use No     Allergies   Patient has no allergy information on record.   Review of Systems Review of Systems  All other systems reviewed and are negative.    Physical Exam Updated Vital Signs BP 130/67 (BP Location: Left  Arm)   Pulse 73   Temp 98.1 F (36.7 C) (Oral)   Resp 16   Ht 5\' 9"  (1.753 m)   Wt 92.1 kg (203 lb)   SpO2 100%   BMI 29.98 kg/m   Physical Exam  Constitutional: He is oriented to person, place, and time. He appears well-developed and well-nourished.  HENT:  Head: Normocephalic.  Mild tenderness to the vertex of scalp with small abrasion but no crepitus and no skull instability. No hemotympanum, no septal hematoma, no malocclusion and no midface tenderness  Eyes: Conjunctivae are normal.  Neck: Normal range of motion. Neck supple.  No cervical midline spine tenderness  Cardiovascular: Normal rate and regular rhythm.   No murmur heard. Pulmonary/Chest: Effort normal and breath sounds normal. No respiratory distress.  Abdominal: Soft. There is no tenderness.  Musculoskeletal: He exhibits tenderness (Left ankle: Obvious closed deformity noted at the ankle with associate swelling, diffusely tender with crepitus. Intact dorsalis pedis pulse with brisk cap refill). He exhibits no edema.  No significant midline spine tenderness crepitus or step-off  Neurological: He is alert and oriented to person, place, and time. He has normal strength. No cranial nerve deficit or sensory deficit. GCS eye subscore is 4. GCS verbal subscore is 5. GCS motor subscore is 6.  Skin: Skin is warm and dry.  Mild abrasion noted to right posterior scapular region without any tenderness to palpation  Psychiatric: He has a normal mood and affect.  Nursing note and vitals reviewed.    ED Treatments / Results  Labs (all labs ordered are listed, but only abnormal results are displayed) Labs Reviewed  I-STAT CHEM 8, ED - Abnormal; Notable for the following:       Result Value   Creatinine, Ser 0.60 (*)    Calcium, Ion 1.13 (*)    All other components within normal limits  CBC WITH DIFFERENTIAL/PLATELET    EKG  EKG Interpretation None       Radiology Dg Ankle Complete Left  Result Date:  11/27/2016 CLINICAL DATA:  Left ankle injury today EXAM: LEFT ANKLE COMPLETE - 3+ VIEW COMPARISON:  None. FINDINGS: Comminuted distal left tibia fracture involving the distal metaphysis and apparently extending distally to the articular surface at the posterior malleolus, with 8 mm lateral displacement of the dominant distal fracture fragment and mild apex anterior angulation. Comminuted non articular fracture of the distal metadiaphysis in the left fibula, with one shaft's with lateral displacement of the dominant distal fracture fragment and mild apex anterior angulation. No additional fractures. No subluxation. No suspicious focal osseous lesion. No radiopaque foreign body. IMPRESSION: 1. Comminuted displaced distal left tibia fracture as detailed, noting probable intra-articular extension at the posterior malleolus. 2. Comminuted non articular distal left fibula fracture as detailed. Electronically Signed   By: Delbert Phenix M.D.   On: 11/27/2016 10:44   Ct Head Wo Contrast  Result Date: 11/27/2016 CLINICAL DATA:  Posterior neck pain and headache after being hit in the head with a log. EXAM: CT HEAD WITHOUT CONTRAST CT CERVICAL SPINE WITHOUT CONTRAST TECHNIQUE: Multidetector CT imaging of the head and cervical spine was performed following the standard protocol without intravenous contrast. Multiplanar CT image reconstructions of the cervical spine were also generated. COMPARISON:  None. FINDINGS: CT HEAD FINDINGS Brain: No evidence of acute infarction, hemorrhage, hydrocephalus, extra-axial collection or mass lesion/mass effect. Vascular: No hyperdense vessel or unexpected calcification. Skull: Normal. Negative for fracture or focal lesion. Sinuses/Orbits: Moderate mucosal thickening in the right maxillary sinus. Small mucous retention cysts in the bilateral sphenoid sinuses. The remaining paranasal sinuses and mastoid air cells are clear. The orbits are unremarkable. Other: None. CT CERVICAL SPINE FINDINGS  Alignment: Normal. Skull base and vertebrae: No acute fracture. No primary bone lesion or focal pathologic process. Soft tissues and spinal canal: No prevertebral fluid or swelling. No visible canal hematoma. Disc levels:  Scattered minimal degenerative changes. Upper chest: Negative. Other: None. IMPRESSION: 1.  No acute intracranial abnormality. 2.  No acute cervical spine fracture. Electronically Signed   By: Obie Dredge M.D.   On: 11/27/2016 11:06   Ct Cervical Spine Wo Contrast  Result Date: 11/27/2016 CLINICAL DATA:  Posterior neck pain and headache after being hit in the head with a log. EXAM: CT HEAD WITHOUT CONTRAST CT CERVICAL SPINE WITHOUT CONTRAST TECHNIQUE: Multidetector CT imaging of the head and cervical spine was performed following the standard protocol without intravenous contrast. Multiplanar CT image reconstructions of the cervical spine were also generated. COMPARISON:  None. FINDINGS: CT HEAD FINDINGS Brain: No evidence of acute infarction, hemorrhage, hydrocephalus, extra-axial collection or mass lesion/mass effect. Vascular: No hyperdense vessel or unexpected calcification. Skull: Normal. Negative for fracture or focal lesion. Sinuses/Orbits: Moderate mucosal thickening in the right maxillary sinus. Small mucous retention cysts in the bilateral sphenoid sinuses. The remaining paranasal sinuses and mastoid  air cells are clear. The orbits are unremarkable. Other: None. CT CERVICAL SPINE FINDINGS Alignment: Normal. Skull base and vertebrae: No acute fracture. No primary bone lesion or focal pathologic process. Soft tissues and spinal canal: No prevertebral fluid or swelling. No visible canal hematoma. Disc levels:  Scattered minimal degenerative changes. Upper chest: Negative. Other: None. IMPRESSION: 1.  No acute intracranial abnormality. 2.  No acute cervical spine fracture. Electronically Signed   By: Obie DredgeWilliam T Derry M.D.   On: 11/27/2016 11:06    Procedures Procedures (including  critical care time)  Medications Ordered in ED Medications  HYDROmorphone (DILAUDID) injection 1 mg (1 mg Intravenous Given 11/27/16 1021)  HYDROmorphone (DILAUDID) injection 1 mg (1 mg Intravenous Given 11/27/16 1059)     Initial Impression / Assessment and Plan / ED Course  I have reviewed the triage vital signs and the nursing notes.  Pertinent labs & imaging results that were available during my care of the patient were reviewed by me and considered in my medical decision making (see chart for details).     BP 121/74   Pulse 83   Temp 98.1 F (36.7 C) (Oral)   Resp (!) 26   Ht 5\' 9"  (1.753 m)   Wt 92.1 kg (203 lb)   SpO2 100%   BMI 29.98 kg/m    Final Clinical Impressions(s) / ED Diagnoses   Final diagnoses:  Traumatic closed fracture of distal tibia with fibula with minimal displacement, right, initial encounter    New Prescriptions New Prescriptions   No medications on file   10:21 AM Pt was hit by a log and fell to the ground.  He felt he broke his left ankle on the process.  The log did not landed on his ankle/foot.  Work up initiated.  Pain medication given.  On exam, obvious closed injury of LLE.  Will obtain preop labs, keep pt NPO and will consult ortho. Care discussed with Dr. Jacqulyn BathLong.   11:41 AM X-ray of left ankle demonstrate comminuted displaced distal left tibial fracture as well as comminuted nonarticular fracture of the left fibula. This is a closed injury. Patient is neurovascular intact. Appreciate consultation from on-call orthopedist Dr. Roda ShuttersXu who have reviewed the x-ray, he requests for patient to be nothing by mouth.  He will request orthopedist Dr. Carola FrostHandy to see pt in the ER and will admit for surgical intervention.   He also request for a CT of the injured site.  I have placed an order for L tib/fib CT w/o CM.     Fayrene Helperran, Avika Carbine, PA-C 11/27/16 1518    Long, Arlyss RepressJoshua G, MD 11/27/16 [phone]

## 2016-11-27 NOTE — ED Triage Notes (Signed)
Pt arrives from work via [facility] reporting log 5' diameter fell 2-3 ft off truck, hitting pt on top of head and forcing pt to ground.  Pt denies LOC, reports new HA.  Pt c/o pain to L ankle, obvious deformity noted.  EMS reports giving 100 mcg Fentanyl.  Ccollar on and aligned, pt AOx4.

## 2016-11-27 NOTE — ED Notes (Signed)
Patient transported to X-ray 

## 2016-11-28 ENCOUNTER — Encounter (HOSPITAL_COMMUNITY): Payer: Self-pay | Admitting: Orthopedic Surgery

## 2016-11-28 DIAGNOSIS — S82832A Other fracture of upper and lower end of left fibula, initial encounter for closed fracture: Secondary | ICD-10-CM | POA: Diagnosis present

## 2016-11-28 DIAGNOSIS — J449 Chronic obstructive pulmonary disease, unspecified: Secondary | ICD-10-CM | POA: Diagnosis present

## 2016-11-28 DIAGNOSIS — F17200 Nicotine dependence, unspecified, uncomplicated: Secondary | ICD-10-CM

## 2016-11-28 DIAGNOSIS — M1990 Unspecified osteoarthritis, unspecified site: Secondary | ICD-10-CM | POA: Diagnosis present

## 2016-11-28 HISTORY — DX: Nicotine dependence, unspecified, uncomplicated: F17.200

## 2016-11-28 LAB — BASIC METABOLIC PANEL
Anion gap: 9 (ref 5–15)
BUN: 10 mg/dL (ref 6–20)
CHLORIDE: 102 mmol/L (ref 101–111)
CO2: 25 mmol/L (ref 22–32)
CREATININE: 0.78 mg/dL (ref 0.61–1.24)
Calcium: 8.5 mg/dL — ABNORMAL LOW (ref 8.9–10.3)
Glucose, Bld: 87 mg/dL (ref 65–99)
POTASSIUM: 4 mmol/L (ref 3.5–5.1)
Sodium: 136 mmol/L (ref 135–145)

## 2016-11-28 MED ORDER — LEVALBUTEROL TARTRATE 45 MCG/ACT IN AERO: 1.0000 | INHALATION_SPRAY | Freq: Three times a day (TID) | RESPIRATORY_TRACT | Status: DC | PRN

## 2016-11-28 MED ORDER — HYDROMORPHONE HCL 1 MG/ML IJ SOLN
1.0000 mg | INTRAMUSCULAR | Status: DC | PRN
  Administered 2016-11-28 – 2016-12-02 (×26): 2 mg via INTRAVENOUS
  Filled 2016-11-28 (×26): qty 2

## 2016-11-28 MED ORDER — OXYCODONE HCL 5 MG PO TABS
5.0000 mg | ORAL_TABLET | Freq: Four times a day (QID) | ORAL | Status: DC | PRN
  Administered 2016-11-28 – 2016-12-01 (×11): 15 mg via ORAL
  Filled 2016-11-28 (×11): qty 3

## 2016-11-28 MED ORDER — OXYCODONE-ACETAMINOPHEN 7.5-325 MG PO TABS
1.0000 | ORAL_TABLET | Freq: Four times a day (QID) | ORAL | Status: DC | PRN
  Administered 2016-11-28 – 2016-12-02 (×17): 2 via ORAL
  Filled 2016-11-28 (×17): qty 2

## 2016-11-28 NOTE — Progress Notes (Signed)
Orthopedic Trauma Service Progress Note   Patient ID: Oscar Martinez MRN: [id number] DOB/AGE: December 09, 1975 41 y.o.  Subjective:  Doing ok  States pain meds, although effective, are not lasting long enough  No other complaints Denies any additional injuries    Review of Systems  Constitutional: Negative for chills and fever.  Eyes: Negative for blurred vision.  Respiratory: Negative for shortness of breath.   Cardiovascular: Negative for chest pain.  Gastrointestinal: Negative for abdominal pain, nausea and vomiting.  Neurological: Negative for tingling and sensory change.    Objective:   VITALS:   Vitals:   11/27/16 1830 11/27/16 1850 11/27/16 2200 11/28/16 0451  BP:  (!) 169/83 136/76 (!) 140/93  Pulse: 92 90 91 86  Resp: 15  18 18   Temp:  99 F (37.2 C) 98.2 F (36.8 C) 98.2 F (36.8 C)  TempSrc:  Oral Oral Oral  SpO2: 98% 97% 94% 100%  Weight:      Height:        Intake/Output      08/09 0701 - 08/10 0700 08/10 0701 - 08/11 0700   I.V. (mL/kg) 1025 (11.1)    IV Piggyback 300    Total Intake(mL/kg) 1325 (14.4)    Urine (mL/kg/hr) 1000    Blood 5    Total Output 1005     Net +320          Urine Occurrence 2 x      LABS  Results for orders placed or performed during the hospital encounter of 11/27/16 (from the past 24 hour(s))  I-stat chem 8, ed     Status: Abnormal   Collection Time: 11/27/16 11:49 AM  Result Value Ref Range   Sodium 138 135 - 145 mmol/L   Potassium 4.8 3.5 - 5.1 mmol/L   Chloride 106 101 - 111 mmol/L   BUN 17 6 - 20 mg/dL   Creatinine, Ser 1.47 (L) 0.61 - 1.24 mg/dL   Glucose, Bld 96 65 - 99 mg/dL   Calcium, Ion 8.29 (L) 1.15 - 1.40 mmol/L   TCO2 24 0 - 100 mmol/L   Hemoglobin 13.6 13.0 - 17.0 g/dL   HCT 56.2 13.0 - 86.5 %  CBC     Status: Abnormal   Collection Time: 11/27/16  7:42 PM  Result Value Ref Range   WBC 10.8 (H) 4.0 - 10.5 K/uL   RBC 4.32 4.22 - 5.81 MIL/uL   Hemoglobin 12.9 (L) 13.0 - 17.0  g/dL   HCT 78.4 (L) 69.6 - 29.5 %   MCV 87.3 78.0 - 100.0 fL   MCH 29.9 26.0 - 34.0 pg   MCHC 34.2 30.0 - 36.0 g/dL   RDW 28.4 13.2 - 44.0 %   Platelets 141 (L) 150 - 400 K/uL  Creatinine, serum     Status: None   Collection Time: 11/27/16  7:42 PM  Result Value Ref Range   Creatinine, Ser 0.86 0.61 - 1.24 mg/dL   GFR calc non Af Amer >60 >60 mL/min   GFR calc Af Amer >60 >60 mL/min  Basic metabolic panel     Status: Abnormal   Collection Time: 11/28/16  3:47 AM  Result Value Ref Range   Sodium 136 135 - 145 mmol/L   Potassium 4.0 3.5 - 5.1 mmol/L   Chloride 102 101 - 111 mmol/L   CO2 25 22 - 32 mmol/L   Glucose, Bld 87 65 - 99 mg/dL   BUN 10 6 - 20 mg/dL   Creatinine, Ser  0.78 0.61 - 1.24 mg/dL   Calcium 8.5 (L) 8.9 - 10.3 mg/dL   GFR calc non Af Amer >60 >60 mL/min   GFR calc Af Amer >60 >60 mL/min   Anion gap 9 5 - 15     PHYSICAL EXAM:   Gen: resting comfortably in bed, NAD, appears well  Lungs: clear anterior fields, no wheezes noted, diminished sounds at bases  Cardiac: RRR, s1 and s2 Abd: +BS, NTND Ext:       Left Lower Extremity   Ex fix stable  All dressings look great, scant drainage at transcalcaneal pin but expected  Swelling stable  Leg is nicely elevated  Ext is warm  + DP pulse  EHL, FHL and lesser toe motor are grossly intact  No pain with passive stretch   Assessment/Plan: 1 Day Post-Op   Principal Problem:   Closed fracture of left distal tibia Active Problems:   Closed fracture of left distal fibula   COPD (chronic obstructive pulmonary disease) (HCC)   Arthritis   Anti-infectives    Start     Dose/Rate Route Frequency Ordered Stop   11/27/16 1930  ceFAZolin (ANCEF) IVPB 1 g/50 mL premix     1 g 100 mL/hr over 30 Minutes Intravenous Every 6 hours 11/27/16 1543 11/28/16 0806    .  POD/HD#: 1  41 y/o male s/p crush by log with left distal tibia fracture   -closed left distal tibia and fibula fracture s/p ex fix   NwB L  leg  Hopeful for OR on Tuesday for definitive fixation   Fracture not amenable to IMN, will require plate osteosynthesis,likely medial plate    I will change dressing on Monday to eval soft tissue in preparation for surgery on Tuesday     Want pt to keep leg elevated as much as possible over the weekend  Cryotherapy round the clock   Ok to move toes to help with swelling as well   Ok to work with therapies    - Pain management:  Added percocet to regimen     Schedule is the following    Percocet 7.5/325 1-2 po q6h prn   Oxy IR 5-15 mg po q6h prn breakthrough pain only, to be given between percocet doses   neurontin 300 mg po q8h    Robaxin [phone] mg po q6h prn spasms   Dilaudid IV 1-2 mg IV q3h prn severe breakthrough pain. To be given only after all PO meds have been used  - ABL anemia/Hemodynamics  Stable  Check cbc in am   - Medical issues   Nicotine dependence   No nicotine patches or nicotine products   - DVT/PE prophylaxis:  Lovenox   - ID:   periop abx    - Activity:  NWB left leg   - FEN/GI prophylaxis/Foley/Lines:  Reg diet   protonix   IVF  -Ex-fix/Splint care:  Ok to manipulate leg by fixator   - Impediments to fracture healing:  Nicotine dependence   - Dispo:  Continue with inpatient care  Hopeful for definitive fixation on Tuesday  Aggressive ice and elevation of left leg     Mearl LatinKeith W. Bo Rogue, PA-C [facility] [phone] (P) [phone] (O) 11/28/2016, 10:28 AM

## 2016-11-28 NOTE — Care Management Note (Signed)
Case Management Note  Patient Details  Name: Oscar Martinez MRN: [id number] Date of Birth: 03/14/1976  Subjective/Objective:   41 yr old gentleman s/p left distal  tibia fracture secondary to injury at work. Patient underwent application of external fixator.         Action/Plan: Case Manager spoke with patient concerning injury. He says that he was on the job and this will be a worker's compensation claim, Someone is to contact the hospital. Plan is for patient to return to surgery possibly on Tuesday, December 02, 2016. Case manager will continue to monitor.     Expected Discharge Date:     pending             Expected Discharge Plan:   pending  In-House Referral:     Discharge planning Services  CM Consult  Post Acute Care Choice:    Choice offered to:     DME Arranged:    DME Agency:     HH Arranged:    HH Agency:     Status of Service:  In process, will continue to follow  If discussed at Long Length of Stay Meetings, dates discussed:    Additional Comments:  Durenda GuthrieBrady, Bernell Haynie Naomi, RN 11/28/2016, 2:11 PM

## 2016-11-28 NOTE — Evaluation (Signed)
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 Pt currently with functional limitations due to the deficits listed below (see PT Problem List).  Pt will benefit from skilled PT to increase their independence and safety with mobility to allow discharge to the venue listed below.  Pt able to maintain NWB, cooperative with PT but limited by exhaustion and pain this date; will continue to follow in acute setting  and assess for needs       Follow Up Recommendations DC plan and follow up therapy as arranged by surgeon    Equipment Recommendations  Other (comment) (TBD-RW vs crutches(?))    Recommendations for Other Services       Precautions / Restrictions Precautions Precautions: Fall Restrictions Weight Bearing Restrictions: Yes LLE Weight Bearing: Non weight bearing      Mobility  Bed Mobility Overal bed mobility: Needs Assistance Bed Mobility: Supine to Sit;Sit to Supine     Supine to sit: Min guard Sit to supine: Min guard   General bed mobility comments: for LLE, safety  Transfers Overall transfer level: Needs assistance Equipment used: Rolling walker (2 wheeled) Transfers: Sit to/from Stand Sit to Stand: Min guard         General transfer comment: cues for hand placement and safety  Ambulation/Gait Ambulation/Gait assistance: Min assist;Min guard Ambulation Distance (Feet):  (lateral steps along EOB only ) Assistive device: Rolling walker (2 wheeled)   Gait velocity: pt returned to bed upon his request bc he reports he has not slept inover 24 hrs; pain level also limiting mobility at this time   General Gait Details: pt  able to maintain NWB and take lateral steps along EOB with cues for NWB/safety and min/guard for balance  Stairs            Wheelchair Mobility    Modified Rankin (Stroke Patients Only)       Balance Overall balance assessment: Needs assistance         Standing balance support: Bilateral upper extremity supported Standing balance-Leahy Scale: Poor Standing balance comment: reliant on UEs                              Pertinent Vitals/Pain Pain Assessment: 0-10 Pain Score: 7  Pain Location: LLE Pain Descriptors / Indicators: Crushing;Grimacing;Guarding;Sore Pain Intervention(s): Limited activity within patient's tolerance;Monitored during session;Premedicated before session;RN gave pain meds during session    Home Living Family/patient expects to be discharged to:: Private residence Living Arrangements: Spouse/significant other;Children Available Help at Discharge: Family;Available PRN/intermittently Type of Home: Mobile home Home Access: Ramped entrance     Home Layout: One level Home Equipment: None Additional Comments: wife works variable hours    Prior Function Level of Independence: Independent         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"     Higher education careers adviserHand Dominance        Extremity/Trunk Assessment   Upper Extremity Assessment Upper Extremity Assessment: Defer to OT evaluation    Lower Extremity Assessment Lower Extremity Assessment: LLE deficits/detail LLE Deficits / Details: intact to light touch, able to move toes actively; knee/hip AROM grossly WFL, strength grossly 3/5  LLE: Unable  to fully assess due to immobilization;Unable to fully assess due to pain       Communication   Communication: No difficulties  Cognition Arousal/Alertness: Awake/alert Behavior During Therapy: WFL for tasks assessed/performed Overall Cognitive Status: Within Functional Limits for tasks assessed                                        General  Comments      Exercises Other Exercises Other Exercises: encouraged AROM L  knee and hip, pt able to return demo   Assessment/Plan    PT Assessment Patient needs continued PT services  PT Problem List Decreased knowledge of use of DME;Decreased mobility;Decreased safety awareness;Decreased knowledge of precautions;Pain       PT Treatment Interventions DME instruction;Gait training;Functional mobility training;Patient/family education;Therapeutic activities    PT Goals (Current goals can be found in the Care Plan section)  Acute Rehab PT Goals Patient Stated Goal: to go home  PT Goal Formulation: With patient Time For Goal Achievement: 12/05/16 Potential to Achieve Goals: Good    Frequency Min 6X/week   Barriers to discharge Decreased caregiver support wife works variable hours, will have intermittent assist    Co-evaluation               AM-PAC PT "6 Clicks" Daily Activity  Outcome Measure Difficulty turning over in bed (including adjusting bedclothes, sheets and blankets)?: A Little Difficulty moving from lying on back to sitting on the side of the bed? : A Little Difficulty sitting down on and standing up from a chair with arms (e.g., wheelchair, bedside commode, etc,.)?: A Little Help needed moving to and from a bed to chair (including a wheelchair)?: A Little Help needed walking in hospital room?: A Little Help needed climbing 3-5 steps with a railing? : A Lot 6 Click Score: 17    End of Session   Activity Tolerance: Patient limited by pain;Patient tolerated treatment well Patient left: in bed;with call bell/phone within reach;with bed alarm set (LLE elevated)   PT Visit Diagnosis: Difficulty in walking, not elsewhere classified (R26.2);Pain Pain - Right/Left: Left Pain - part of body: Leg    Time: 1610-9604 PT Time Calculation (min) (ACUTE ONLY): 28 min   Charges:   PT Evaluation $PT Eval Low Complexity: 1 Low PT Treatments $Therapeutic Activity:  8-22 mins   PT G CodesDrucilla Chalet, PT Pager: [phone] 11/28/2016   [facility] 11/28/2016, 12:29 PM

## 2016-11-28 NOTE — Progress Notes (Signed)
Pt asked RN if he can go outside and smoke. RN stated that he cannot. Pt states if he doesn't get a nicotine patch he is going to go in the bathroom and smoke. RN paged PA to get an order for a nicotine patch.

## 2016-11-28 NOTE — Evaluation (Signed)
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 Currently pt requires min guard assist for squat pivot transfer and min assist for LB ADL. Pt planning to d/c home with intermittent supervision from family. Pt would benefit from continued skilled OT to address established goals.    Follow Up Recommendations  No OT follow up;Supervision - Intermittent    Equipment Recommendations  3 in 1 bedside commode;Tub/shower bench (pending weight bearing status)    Recommendations for Other Services       Precautions / Restrictions Precautions Precautions: Fall Restrictions Weight Bearing Restrictions: Yes LLE Weight Bearing: Non weight bearing      Mobility Bed Mobility Overal bed mobility: Needs Assistance Bed Mobility: Supine to Sit     Supine to sit: Supervision     General bed mobility comments: HOB flat with use of bed rail. Supervision for safety  Transfers Overall transfer level: Needs assistance Equipment used: None Transfers: Squat Pivot Transfers     Squat pivot transfers: Min guard     General transfer comment: Safe technique, min guard for safety    Balance Overall balance assessment: Needs assistance Sitting-balance support: No upper extremity supported Sitting balance-Leahy Scale: Good     Standing balance support: Bilateral upper extremity supported Standing balance-Leahy Scale: Poor                             ADL either performed or assessed with clinical judgement   ADL Overall ADL's : Needs assistance/impaired Eating/Feeding: Set up;Sitting   Grooming: Set up;Sitting   Upper Body  Bathing: Set up;Sitting   Lower Body Bathing: Minimal assistance;Sit to/from stand   Upper Body Dressing : Set up;Sitting   Lower Body Dressing: Minimal assistance;Sit to/from stand Lower Body Dressing Details (indicate cue type and reason): due to ex fix Toilet Transfer: Min Forensic psychologist Details (indicate cue type and reason): simulated by transfer EOB>chair         Functional mobility during ADLs: Min guard (for squat pivot transfer) General ADL Comments: Pt reports he wants to go smoke and will smoke in bathroom; RN notified. Educated pt on health risks with smoking; pt verbalized understanding.     Vision         Perception     Praxis      Pertinent Vitals/Pain Pain Assessment: Faces Faces Pain Scale: Hurts little more Pain Location: LLE Pain Descriptors / Indicators: Aching;Sore Pain Intervention(s): Monitored during session;Limited activity within patient's tolerance;Repositioned     Hand Dominance     Extremity/Trunk Assessment Upper Extremity Assessment Upper Extremity Assessment: Overall WFL for tasks assessed   Lower Extremity Assessment Lower Extremity Assessment: Defer to PT evaluation   Cervical / Trunk Assessment Cervical / Trunk Assessment: Normal   Communication Communication Communication: No difficulties   Cognition Arousal/Alertness: Awake/alert Behavior During Therapy: WFL for tasks assessed/performed Overall Cognitive Status: Within Functional Limits for tasks assessed                                     General Comments  Exercises     Shoulder Instructions      Home Living Family/patient expects to be discharged to:: Private residence Living Arrangements: Spouse/significant other;Children Available Help at Discharge: Family;Available PRN/intermittently Type of Home: Mobile home Home Access: Ramped entrance     Home Layout: One level     Bathroom Shower/Tub: Tub/shower unit;Curtain    FirefighterBathroom Toilet: Standard     Home Equipment: Shower seat          Prior Functioning/Environment Level of Independence: Independent        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"        OT Problem List: Impaired balance (sitting and/or standing);Decreased knowledge of use of DME or AE;Decreased knowledge of precautions;Pain      OT Treatment/Interventions: Self-care/ADL training;DME and/or AE instruction;Therapeutic activities;Patient/family education;Balance training    OT Goals(Current goals can be found in the care plan section) Acute Rehab OT Goals Patient Stated Goal: smoke OT Goal Formulation: With patient/family Time For Goal Achievement: 12/12/16 Potential to Achieve Goals: Good ADL Goals Pt Will Perform Lower Body Bathing: with supervision;sit to/from stand Pt Will Perform Lower Body Dressing: with supervision;sit to/from stand Pt Will Transfer to Toilet: with supervision;ambulating;regular height toilet Pt Will Perform Toileting - Clothing Manipulation and hygiene: with supervision;sit to/from stand Pt Will Perform Tub/Shower Transfer: Tub transfer;with supervision;ambulating;rolling walker (shower chair vs. tub bench)  OT Frequency: Min 2X/week   Barriers to D/C: Decreased caregiver support  wife works during the day       Co-evaluation              AM-PAC PT "6 Clicks" Daily Activity     Outcome Measure Help from another person eating meals?: None Help from another person taking care of personal grooming?: None Help from another person toileting, which includes using toliet, bedpan, or urinal?: A Little Help from another person bathing (including washing, rinsing, drying)?: A Little Help from another person to put on and taking off regular upper body clothing?: None Help from another person to put on and taking off regular lower body clothing?: A Little 6 Click Score: 21   End of Session Nurse Communication: Mobility status;Other (comment) (pt requesting to  smoke)  Activity Tolerance: Patient tolerated treatment well Patient left: in chair;with call bell/phone within reach;with family/visitor present  OT Visit Diagnosis: Other abnormalities of gait and mobility (R26.89);Pain Pain - Right/Left: Left Pain - part of body: Leg                Time: [id number] OT Time Calculation (min): 16 min Charges:  OT General Charges $OT Visit: 1 Procedure OT Evaluation $OT Eval Moderate Complexity: 1 Procedure G-Codes:     Janeene Sand A. Brett Albinooffey, M.S., OTR/L Pager: [phone]  Gaye AlkenBailey A [place] 11/28/2016, 4:31 PM

## 2016-11-29 LAB — BASIC METABOLIC PANEL
Anion gap: 7 (ref 5–15)
BUN: 8 mg/dL (ref 6–20)
CALCIUM: 8.9 mg/dL (ref 8.9–10.3)
CO2: 31 mmol/L (ref 22–32)
CREATININE: 0.82 mg/dL (ref 0.61–1.24)
Chloride: 98 mmol/L — ABNORMAL LOW (ref 101–111)
GFR calc non Af Amer: >60 mL/min (ref >60)
Glucose, Bld: 105 mg/dL — ABNORMAL HIGH (ref 65–99)
Potassium: 4.2 mmol/L (ref 3.5–5.1)
SODIUM: 136 mmol/L (ref 135–145)

## 2016-11-29 LAB — CBC
HCT: 38.3 % — ABNORMAL LOW (ref 39.0–52.0)
Hemoglobin: 13 g/dL (ref 13.0–17.0)
MCH: 29.6 pg (ref 26.0–34.0)
MCHC: 33.9 g/dL (ref 30.0–36.0)
MCV: 87.2 fL (ref 78.0–100.0)
PLATELETS: 154 10*3/uL (ref 150–400)
RBC: 4.39 MIL/uL (ref 4.22–5.81)
RDW: 13.1 % (ref 11.5–15.5)
WBC: 6.3 10*3/uL (ref 4.0–10.5)

## 2016-11-29 MED ORDER — DIAZEPAM 5 MG PO TABS
5.0000 mg | ORAL_TABLET | Freq: Four times a day (QID) | ORAL | Status: DC | PRN
  Administered 2016-11-29 – 2016-12-01 (×6): 5 mg via ORAL
  Filled 2016-11-29 (×6): qty 1

## 2016-11-29 NOTE — Progress Notes (Signed)
PT Cancellation Note  Patient Details Name: Oscar Martinez MRN: [id number] DOB: [birth year]   Cancelled Treatment:    Reason Eval/Treat Not Completed: Pain limiting ability to participate. Pt in 10/10 L LE pain and deferred PT at this time. Noted pt planned for return to OR on Tuesday. Acute PT to return as able.   Vina Byrd M Chane Magner 11/29/2016, 11:24 AM   Lewis ShockAshly Barbra Miner, PT, DPT Pager #: [phone]: [phone] Office #: [phone]

## 2016-11-29 NOTE — Progress Notes (Signed)
Patient ID: Oscar Martinez, male   DOB: Apr 08, 1976, 41 y.o.   MRN: [id number]     Subjective:  Patient reports pain as mild to moderate. Patient wanting to go outside and smoke or to have Nicotine patch  Objective:   VITALS:   Vitals:   11/28/16 0451 11/28/16 1600 11/28/16 2017 11/29/16 0304  BP: (!) 140/93 126/75 133/86 116/75  Pulse: 86 93 91 75  Resp: 18 18 18    Temp: 98.2 F (36.8 C) 98.3 F (36.8 C) 98.6 F (37 C) 98 F (36.7 C)  TempSrc: Oral Oral Oral Oral  SpO2: 100% 95% 98% 96%  Weight:      Height:        ABD soft Sensation intact distally Dorsiflexion/Plantar flexion intact Incision: dressing C/D/I and scant drainage Ex fix in place and stable  Lab Results  Component Value Date   WBC 6.3 11/29/2016   HGB 13.0 11/29/2016   HCT 38.3 (L) 11/29/2016   MCV 87.2 11/29/2016   PLT 154 11/29/2016   BMET    Component Value Date/Time   NA 136 11/29/2016 0341   K 4.2 11/29/2016 0341   CL 98 (L) 11/29/2016 0341   CO2 31 11/29/2016 0341   GLUCOSE 105 (H) 11/29/2016 0341   BUN 8 11/29/2016 0341   CREATININE 0.82 11/29/2016 0341   CALCIUM 8.9 11/29/2016 0341   GFRNONAA >60 11/29/2016 0341   GFRAA >60 11/29/2016 0341     Assessment/Plan: 2 Days Post-Op   Principal Problem:   Closed fracture of left distal tibia Active Problems:   Closed fracture of left distal fibula   COPD (chronic obstructive pulmonary disease) (HCC)   Arthritis   Nicotine dependence   Advance diet Up with therapy NWB left lower  Plan for OR on Tuesday No smoking and no nicotine patches and patient was made aware of the need to not use the patches or the smoking.   Haskel KhanDOUGLAS PARRY, BRANDON 11/29/2016, 10:48 AM  Discussed and agree with above.   Added valium for pain control to augment the narcotics given severe pain complaints.  No evidence for compartment syndrome.  Teryl LucyJoshua Eustolia Drennen, MD Cell [phone]  '

## 2016-11-30 ENCOUNTER — Encounter (HOSPITAL_COMMUNITY): Payer: Self-pay | Admitting: *Deleted

## 2016-11-30 LAB — CBC
HEMATOCRIT: 37.1 % — AB (ref 39.0–52.0)
HEMOGLOBIN: 12.7 g/dL — AB (ref 13.0–17.0)
MCH: 29.5 pg (ref 26.0–34.0)
MCHC: 34.2 g/dL (ref 30.0–36.0)
MCV: 86.1 fL (ref 78.0–100.0)
Platelets: 175 10*3/uL (ref 150–400)
RBC: 4.31 MIL/uL (ref 4.22–5.81)
RDW: 12.9 % (ref 11.5–15.5)
WBC: 8.5 10*3/uL (ref 4.0–10.5)

## 2016-11-30 MED ORDER — ACETAMINOPHEN 325 MG PO TABS
650.0000 mg | ORAL_TABLET | Freq: Four times a day (QID) | ORAL | Status: DC | PRN
  Administered 2016-11-30: 650 mg via ORAL
  Filled 2016-11-30: qty 2

## 2016-11-30 NOTE — Progress Notes (Signed)
Patient ID: Oscar SomCalvin Martinez, male   DOB: 07-30-1975, 41 y.o.   MRN: [id number]     Subjective:  Patient reports pain as mild to moderate.  Patient in bed and in no acute distress and requesting more pain medicine.  Objective:   VITALS:   Vitals:   11/29/16 1328 11/29/16 2109 11/30/16 0504 11/30/16 1414  BP: 113/73 128/76 134/81 (!) 141/81  Pulse: 99 (!) 101 98 (!) 102  Resp: 18 18 16 18   Temp: 98.9 F (37.2 C) 98.4 F (36.9 C) 98.6 F (37 C) 99 F (37.2 C)  TempSrc: Oral Oral Oral Oral  SpO2: 96% 98% 98% 95%  Weight:      Height:        ABD soft Sensation intact distally Dorsiflexion/Plantar flexion intact Incision: dressing C/D/I and no drainage No pain with passive toe motion  Lab Results  Component Value Date   WBC 8.5 11/30/2016   HGB 12.7 (L) 11/30/2016   HCT 37.1 (L) 11/30/2016   MCV 86.1 11/30/2016   PLT 175 11/30/2016   BMET    Component Value Date/Time   NA 136 11/29/2016 0341   K 4.2 11/29/2016 0341   CL 98 (L) 11/29/2016 0341   CO2 31 11/29/2016 0341   GLUCOSE 105 (H) 11/29/2016 0341   BUN 8 11/29/2016 0341   CREATININE 0.82 11/29/2016 0341   CALCIUM 8.9 11/29/2016 0341   GFRNONAA >60 11/29/2016 0341   GFRAA >60 11/29/2016 0341     Assessment/Plan: 3 Days Post-Op   Principal Problem:   Closed fracture of left distal tibia Active Problems:   Closed fracture of left distal fibula   COPD (chronic obstructive pulmonary disease) (HCC)   Arthritis   Nicotine dependence   Advance diet Up with therapy Continue plan per Dr Carola FrostHandy  Planning OR on Tuesday   Oscar Martinez, Oscar Martinez 11/30/2016, 5:24 PM  Discussed and agree with above.   Teryl LucyJoshua Andjela Wickes, MD Cell [phone]

## 2016-12-01 LAB — SURGICAL PCR SCREEN
MRSA, PCR: NEGATIVE
Staphylococcus aureus: POSITIVE — AB

## 2016-12-01 MED ORDER — CHLORHEXIDINE GLUCONATE CLOTH 2 % EX PADS
6.0000 | MEDICATED_PAD | Freq: Every day | CUTANEOUS | Status: DC
  Administered 2016-12-01: 6 via TOPICAL

## 2016-12-01 MED ORDER — OXYCODONE HCL 5 MG PO TABS
10.0000 mg | ORAL_TABLET | ORAL | Status: DC | PRN
  Administered 2016-12-01 – 2016-12-02 (×8): 15 mg via ORAL
  Filled 2016-12-01 (×8): qty 3

## 2016-12-01 MED ORDER — GABAPENTIN 300 MG PO CAPS
600.0000 mg | ORAL_CAPSULE | Freq: Two times a day (BID) | ORAL | Status: DC
  Administered 2016-12-01 – 2016-12-02 (×2): 600 mg via ORAL
  Filled 2016-12-01 (×3): qty 2

## 2016-12-01 MED ORDER — MUPIROCIN 2 % EX OINT
1.0000 "application " | TOPICAL_OINTMENT | Freq: Two times a day (BID) | CUTANEOUS | Status: DC
  Administered 2016-12-01 (×2): 1 via NASAL
  Filled 2016-12-01 (×3): qty 22

## 2016-12-01 NOTE — Op Note (Signed)
NAMEvert Kohl:  Martinez, Oscar                  ACCOUNT NO.:  [id number]  MEDICAL RECORD NO.:  [id number]  LOCATION:  [id number]                        FACILITY:  [facility]  PHYSICIAN:  Doralee AlbinoMichael H. Carola FrostHandy, M.D. DATE OF BIRTH:  April 28, 1975  DATE OF PROCEDURE:  11/27/2016 DATE OF DISCHARGE:                              OPERATIVE REPORT   PREOPERATIVE DIAGNOSIS:  Crush injury to the left distal tibia and fibula, pilon fracture.  POSTOPERATIVE DIAGNOSIS:  Crush injury to the left distal tibia and fibula, pilon fracture.  PROCEDURES: 1. Closed manipulation of pilon, tibia. 2. Intramedullary nailing, internal fixation of the left lateral     malleolus. 3. Application of external fixator to right leg and right foot.  SURGEON:  Doralee AlbinoMichael H. Carola FrostHandy, M.D.  ASSISTANT:  Oscar MoritaKeith Paul, PA-C.  ANESTHESIA:  General.  COMPLICATIONS:  None.  TOURNIQUET:  None.  DISPOSITION:  To PACU.  CONDITION:  Stable.  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  He was seen and evaluated in the ED for concern about compartment syndrome given the mechanism.  At that time, he did not have pain to passive stretch or the anticipated swelling given the mechanism.  Because he was cognizant, we did feel that he would be acceptable to watch for symptoms and to perform serial examinations in lieu of immediate fasciotomy.  We did discuss the possibility of fasciotomy.  We also discussed the risks and benefits of external fixation and possible limited internal fixation including nerve injury, vessel injury, infection, DVT, PE, pin tract infection, the probability of further surgery, arthritis, loss of motion, and others.  After full discussion, he did wish to proceed.  BRIEF SUMMARY OF PROCEDURE:  [name] was taken to  the operating room, where general anesthesia was induced.  He underwent a chlorhexidine scrub and then Betadine scrub and paint.  His soft tissues continued to swell, but again not so much that we thought that compartment fasciotomy was indicated primarily.  My assistant pulled traction, but was unable to reduce the translation of the tibia.  Consequently, I felt that intramedullary fixation of the tibia as not wished to perform a formal open plating because of the swelling might substantially improve the translation, enable us to get a better reduction, and preserve the possibility of intramedullary nailing as a definitive treatment option in this injury.  A 2 cm incision was made after confirming the appropriate starting point at the tip of the distal fibula on both AP and lateral imaging.  The drill guide was then advanced to the tip of the fibula in line with the intramedullary shaft.  A 3.5 drill was used first and a 3.0 mm rod inserted.  My assistant managed the traction and manipulation while I advanced the rod across the fracture site and into the proximal intramedullary canal.  This did dramatically improve his alignment and provide some stability, but was unable to completely reduce the translation of the tibia.  I then placed the 2 pins in the anterior tibia checking their depth and position with lateral imaging and the appropriate position within the calcaneus.  I then built a mild lateral frame with self holing controlling reduction at the foot.  My assistant tightened the clamps into position and AP, lateral, and mortise views showed appropriate reduction of the tibia and fibula.  We then built an additional mild lateral frame by placing metatarsal pins into the first and fifth metatarsals, securing clamps and then bars building off the calcaneus and this enabled me to bring the foot up into a plantigrade position such that the ankle would be functional in the event of  delay to definitive fixation reducing the chance of equinus contracture.  All pins were wrapped in standard fashion with Kerlix and then an Ace wrap underneath the fixator from toes to knee.  PROGNOSIS:  We are hopeful that the patient will be able to undergo early definitive fixation, possibly with a percutaneous plate.  We will obtain a CT scan for surgical planning.  He is at elevated risk for infection and nonunion as well as delayed union given his heavy smoking history.  He will be on formal pharmacologic DVT prophylaxis and again, will ice and elevate aggressively to resolve the soft tissue swelling, continue to be followed with serial examination for the possibility of compartment syndrome given his mechanism.     Doralee Albino. Carola Frost, M.D.     MHH/MEDQ  D:  12/01/2016  T:  12/01/2016  Job:  [id number]

## 2016-12-01 NOTE — Progress Notes (Signed)
Pt was sitting on chair, tried to get up to his bed by himself, without using his walker, and sustained a fall on his buttocks. Vitals taken, doctor notified. Pt educated about using the call bell especially when taking soo much pain medicine.

## 2016-12-01 NOTE — Progress Notes (Signed)
Orthopedic Trauma Service Progress Note   Patient ID: Oscar Martinez MRN: [id number] DOB/AGE: Rankin 31, 1977 41 y.o.  Subjective:  Doing ok C/o deep ache in L ankle Occasional tingling in L foot   No other complaints  Has been working with therapies  Tolerating diet  Pt lives in a trailer, few steps to get in Lives with wife who works  Lives in [place]  Very fixated on the pain meds he will go home with   He will dc home with perc, oxy IR, robaxin and neurontin  Told pt I will not write for benzos   Review of Systems  Constitutional: Negative for chills and fever.  Respiratory: Negative for shortness of breath and wheezing.   Cardiovascular: Negative for chest pain and palpitations.  Gastrointestinal: Negative for abdominal pain, nausea and vomiting.      Objective:   VITALS:   Vitals:   11/30/16 1414 11/30/16 2104 12/01/16 0544 12/01/16 0614  BP: (!) 141/81 136/87 119/72 133/64  Pulse: (!) 102 (!) 107 (!) 104 (!) 102  Resp: 18 18 16 16   Temp: 99 F (37.2 C) (!) 97.5 F (36.4 C) 98 F (36.7 C) 99.4 F (37.4 C)  TempSrc: Oral Oral Oral Oral  SpO2: 95% 97% 95% 94%  Weight:      Height:        Intake/Output      08/12 0701 - 08/13 0700 08/13 0701 - 08/14 0700   P.O.  480   Total Intake(mL/kg)  480 (5.2)   Urine (mL/kg/hr) 700 (0.3) 200 (0.5)   Total Output 700 200   Net -700 +280        Urine Occurrence 3 x      LABS  Results for orders placed or performed during the hospital encounter of 11/27/16 (from the past 24 hour(s))  Surgical PCR screen     Status: Abnormal   Collection Time: 12/01/16  8:05 AM  Result Value Ref Range   MRSA, PCR NEGATIVE NEGATIVE   Staphylococcus aureus POSITIVE (A) NEGATIVE     PHYSICAL EXAM:   Gen: sitting in bedside chair, NAD, appears well Lungs: breathing unlabored Cardiac: regular  Ext:        Left Lower Extremity   Dressings removed  Ex fix stable   All pinsites look great   Fibular  incision looks good, no drainage  Extensive swelling about ankle   Skin does not wrinkle with gentle compression around the ankle   DPN, SPN, TN sensation intact  EHL, FHL, lesser toe motor grossly intact  Ext warm  + DP pulse  Compartments are soft  No pain out of proportion with passive stretch of toes   Assessment/Plan: 4 Days Post-Op   Principal Problem:   Closed fracture of left distal tibia Active Problems:   Closed fracture of left distal fibula   COPD (chronic obstructive pulmonary disease) (HCC)   Arthritis   Nicotine dependence   Anti-infectives    Start     Dose/Rate Route Frequency Ordered Stop   11/27/16 1930  ceFAZolin (ANCEF) IVPB 1 g/50 mL premix     1 g 100 mL/hr over 30 Minutes Intravenous Every 6 hours 11/27/16 1543 11/28/16 0806    .  POD/HD#: 55   41 y/o male s/p crush by log with left distal tibia fracture    -closed left distal tibia and fibula fracture s/p ex fix              pt too swollen  for definitive procedure tomorrow  Do no think he will be ready at the end of the week either  Plan for dc home tomorrow  Will put on schedule for either next Tuesday or Thursday     All dressings changed by myself this am  [facility]remove all dressings before going in the shower. Can clean ankle, ex fix with soap and water including pinsites.  New dressing to be applied after pt showers   Aggressive ice and elevation    Reviewed pincare and dressing changes with pt    - Pain management:                 Schedule is the following                          Percocet 7.5/325 1-2 po q6h prn                         Oxy IR 10-15 mg po q3h prn breakthrough pain only, to be given between percocet doses                         neurontin 600 mg po q12h                         Robaxin [phone] mg po q6h prn spasms                         Dilaudid IV 1-2 mg IV q3h prn severe breakthrough pain. To be given only after all PO meds have been used    dc'd  valium    - ABL anemia/Hemodynamics             Stable               - Medical issues              Nicotine dependence                         No nicotine patches or nicotine products    - DVT/PE prophylaxis:             Lovenox    - ID:              periop abx completed      - Activity:             NWB left leg    - FEN/GI prophylaxis/Foley/Lines:             Reg diet              protonix              IVF   -Ex-fix/Splint care:             Ok to manipulate leg by fixator    - Impediments to fracture healing:             Nicotine dependence    - Dispo:             dc home tomorrow   Mearl LatinKeith W. Jaonna Word, PA-C [facility] [phone](P) [phone] (O) 12/01/2016, 10:57 AM

## 2016-12-01 NOTE — Progress Notes (Signed)
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 Pt educated for safety, not to mobilize without staff as well as use of RW and HEP. Will continue to follow and assess D/C plan at this time OPPT when cleared by MD for gait is appropriate.  Pt fell with RN after ambulating on own yesterday and reinforced assist needed.    Follow Up Recommendations  Outpatient PT (when cleared by MD for gait)     Equipment Recommendations  Rolling walker with 5" wheels    Recommendations for Other Services       Precautions / Restrictions Precautions Precautions: Fall Restrictions Weight Bearing Restrictions: (P) Yes LLE Weight Bearing: Non weight bearing    Mobility  Bed Mobility Overal bed mobility: Modified Independent             General bed mobility comments: HOB 30 degrees  Transfers Overall transfer level: Needs assistance   Transfers: Sit to/from Stand Sit to Stand: Supervision            Ambulation/Gait Ambulation/Gait assistance: Min guard Ambulation Distance (Feet): 150 Feet Assistive device: Rolling walker (2 wheeled) Gait Pattern/deviations: Step-to pattern   Gait velocity interpretation: Below normal speed for age/gender General Gait Details: pt with cues for position in RW, maintaining NWB well throughout   Stairs            Wheelchair Mobility    Modified Rankin (Stroke Patients Only)       Balance Overall balance assessment: No apparent balance deficits (not formally assessed)                                          Cognition Arousal/Alertness: Awake/alert Behavior During  Therapy: WFL for tasks assessed/performed Overall Cognitive Status: Within Functional Limits for tasks assessed                                        Exercises General Exercises - Lower Extremity Long Arc Quad: AROM;Left;Seated;15 reps    General Comments        Pertinent Vitals/Pain Pain Assessment: No/denies pain    Home Living                      Prior Function            PT Goals (current goals can now be found in the care plan section) Progress towards PT goals: Progressing toward goals    Frequency    Min 5X/week      PT Plan Current plan remains appropriate;Frequency needs to be updated    Co-evaluation              AM-PAC PT "6 Clicks" Daily Activity  Outcome Measure  Difficulty turning over in bed (including adjusting bedclothes, sheets and blankets)?: A Little Difficulty moving from lying on back to sitting on the side of the bed? : A Little Difficulty sitting down on and standing up from a chair with  arms (e.g., wheelchair, bedside commode, etc,.)?: A Little Help needed moving to and from a bed to chair (including a wheelchair)?: A Little Help needed walking in hospital room?: A Little Help needed climbing 3-5 steps with a railing? : A Little 6 Click Score: 18    End of Session Equipment Utilized During Treatment: Gait belt Activity Tolerance: Patient tolerated treatment well Patient left: in chair;with call bell/phone within reach;with chair alarm set Nurse Communication: Mobility status PT Visit Diagnosis: Difficulty in walking, not elsewhere classified (R26.2);Pain Pain - Right/Left: Left Pain - part of body: Leg     Time: 0801-0820 PT Time Calculation (min) (ACUTE ONLY): 19 min  Charges:  $Gait Training: 8-22 mins                    G Codes:       Oscar MeigsMaija Tabor Blaire Martinez, PT [phone]    Oscar Martinez 12/01/2016, 9:54 AM

## 2016-12-01 NOTE — Progress Notes (Signed)
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 Provided education on tub transfer with shower chair. Pt performed transfer with Min Guard A and VCs for safety. Pt verbalized that he would prefer tub bench. Pt demonstrating good adherence to NWB precautions throughout session. Will continue to follow acutely to facilitate safe dc.    Follow Up Recommendations  No OT follow up;Supervision - Intermittent    Equipment Recommendations  3 in 1 bedside commode;Tub/shower bench (pending weight bearing status)    Recommendations for Other Services      Precautions / Restrictions Precautions Precautions: Fall Restrictions Weight Bearing Restrictions: Yes LLE Weight Bearing: Non weight bearing       Mobility Bed Mobility Overal bed mobility: Modified Independent Bed Mobility: Supine to Sit     Supine to sit: Supervision     General bed mobility comments: HOB 30 degrees  Transfers Overall transfer level: Needs assistance Equipment used: None Transfers: Sit to/from Stand Sit to Stand: Min guard         General transfer comment: Safe technique, min guard for safety    Balance Overall balance assessment: No apparent balance deficits (not formally assessed) Sitting-balance support: No upper extremity supported Sitting balance-Leahy Scale: Good     Standing balance support: Bilateral upper extremity supported Standing balance-Leahy Scale: Poor Standing balance comment: reliant on UEs                            ADL either performed or assessed with clinical judgement   ADL Overall ADL's : Needs assistance/impaired                        Lower Body Dressing Details (indicate cue type and reason): Educated pt on good clothes for LB dressing.          Tub/ Shower Transfer: Tub transfer;Min guard;Ambulation;Rolling walker;Shower Field seismologist Details (indicate cue type and reason): Educated pt on safe tub transfer. Pt stating he would prefer tub bench. Discussed purchase option and prices. Pt states he want to talk to his wife Functional mobility during ADLs: Min guard;Rolling walker General ADL Comments: Educated pt on tub transfer. Pt dmeonstrating safety technique and adherance to NWB precautions     Vision       Perception     Praxis      Cognition Arousal/Alertness: Awake/alert Behavior During Therapy: WFL for tasks assessed/performed Overall Cognitive Status: Within Functional Limits for tasks assessed                                          Exercises     Shoulder Instructions       General Comments      Pertinent Vitals/ Pain       Pain Assessment: Faces Faces Pain Scale: Hurts little more Pain Location: LLE Pain Descriptors / Indicators: Aching;Sore Pain Intervention(s): Monitored during session;Limited activity within patient's tolerance;Repositioned;Patient requesting pain meds-RN notified  Home Living  Prior Functioning/Environment              Frequency  Min 2X/week        Progress Toward Goals  OT Goals(current goals can now be found in the care plan section)  Progress towards OT goals: Progressing toward goals  Acute Rehab OT Goals Patient Stated Goal: smoke OT Goal Formulation: With patient/family Time For Goal Achievement: 12/12/16 Potential to Achieve Goals: Good ADL Goals Pt Will Perform Lower Body Bathing: with supervision;sit to/from stand Pt Will Perform Lower Body Dressing: with supervision;sit to/from stand Pt Will Transfer to Toilet: with  supervision;ambulating;regular height toilet Pt Will Perform Toileting - Clothing Manipulation and hygiene: with supervision;sit to/from stand Pt Will Perform Tub/Shower Transfer: Tub transfer;with supervision;ambulating;rolling walker (shower chair vs. tub bench)  Plan Discharge plan remains appropriate    Co-evaluation                 AM-PAC PT "6 Clicks" Daily Activity     Outcome Measure   Help from another person eating meals?: None Help from another person taking care of personal grooming?: None Help from another person toileting, which includes using toliet, bedpan, or urinal?: A Little Help from another person bathing (including washing, rinsing, drying)?: A Little Help from another person to put on and taking off regular upper body clothing?: None Help from another person to put on and taking off regular lower body clothing?: A Little 6 Click Score: 21    End of Session Equipment Utilized During Treatment: Gait belt;Rolling walker  OT Visit Diagnosis: Other abnormalities of gait and mobility (R26.89);Pain Pain - Right/Left: Left Pain - part of body: Leg   Activity Tolerance Patient tolerated treatment well   Patient Left in chair;with call bell/phone within reach;with nursing/sitter in room   Nurse Communication Mobility status        Time: [id number] OT Time Calculation (min): 24 min  Charges: OT General Charges $OT Visit: 1 Procedure OT Treatments $Self Care/Home Management : 23-37 mins  Tashala Cumbo MSOT, OTR/L Acute Rehab Pager: [phone] Office: [phone]   Theodoro GristCharis M Arden Axon 12/01/2016, 2:22 PM

## 2016-12-01 NOTE — Progress Notes (Signed)
Transitions of Care - Pain Medication Review for Discharge to Home  Patient Oscar SomCalvin Martinez is a 41 YO M hospitalized on 11/27/16, s/p external fixation of L tibia and fibula fx after crush injury by a log at work. Will be following up in ~10 days for next procedure.   Assessment/Recommendation:  Patient s/p injury with existing PTA chronic pain, on norco. Appears to need emotional support in addition to medication to manage pain while in-patient. Pain decreases by ~40-50% when administered oxycodone 15 mg followed by 1-2 mg IV of hydromorphone. While in-patient, he is on approximately 260 mg of morphine equivalents daily.   For multimodal pain management at discharge, consider:    1. Acetaminophen 1000 mg PO TID x 7 days for continuous pain control,  then taper to 650 mg po q6h PRN.    2. Oxycontin 40 mg po q12h x 7 days, then taper after next surgery   3. Oxycodone 5 mg 1-2 tabs po q4h prn severe breakthrough pain #84   4. D/c PTA Norco 7.5-325.     5. Continue Gabapentin 600 mg po BID, consider increasing to TID if  appropriate.    6. Consider initiating SNRI, such as duloxetine 60 mg po daily, as it is  shown to aid in musculoskeletal pain and [place] help with any potential  depression after his traumatic injury.  ----------------------------------------------------------  Prior to admission  PTA, patient is on hydrocodone-acetaminophen 7.5-325 mg po TID PRN pain, which is 22.5 mg morphine equivalents daily.  Pertinent co-morbid conditions include chronic back and leg arthritis.  CrCl= 137.7 ml/min (using Cockcroft-Gault equation).  Other pertinent PTA medications include Proair and Advil.    CSRS reviewed on 11/28/16 and shows that the patient should have 13-14 days worth of their home opiate pain medications remaining. Based on CSRS, the patient's total home opioid dose seems to change with him having had a few prescriptions for oxycodone IR from what appears to be the ED.    During Admission  Today, patient reports pain scores between 4 and 8 for left leg pain.  Scheduled Opioids:  - none  PRN Opioids - hydromorphone 1-2 mg IV q3h PRN severe, unresolved breakthrough pain  - oxycodone 10-15 mg po q3h prn breakthrough pain between doses of percocet  - oxycodone-acetaminophen 7.5-325 mg po q6h PRN mod-severe pain  Mg of Morphine Equivalents (past 24 hours): 266.5 mg  Non-opioid analgesics:  - Gabapentin 600 mg po BID - Acetaminophen 650 mg po q6h prn mild pain (not taking)   Susy FrizzleNicole Davonne Baby Student-PharmD

## 2016-12-02 MED ORDER — OXYCODONE-ACETAMINOPHEN 10-325 MG PO TABS: 1.0000 | ORAL_TABLET | Freq: Four times a day (QID) | ORAL | 0 refills | Status: DC | PRN

## 2016-12-02 MED ORDER — POLYETHYLENE GLYCOL 3350 17 G PO PACK: 17.0000 g | PACK | Freq: Every day | ORAL | Status: DC

## 2016-12-02 MED ORDER — METHOCARBAMOL 500 MG PO TABS: 500.0000 mg | ORAL_TABLET | Freq: Four times a day (QID) | ORAL | 0 refills | Status: DC | PRN

## 2016-12-02 MED ORDER — OXYCODONE HCL 5 MG PO TABS: 5.0000 mg | ORAL_TABLET | Freq: Four times a day (QID) | ORAL | 0 refills | Status: DC | PRN

## 2016-12-02 MED ORDER — POLYETHYLENE GLYCOL 3350 17 G PO PACK: 17.0000 g | PACK | Freq: Every day | ORAL | 0 refills | Status: AC

## 2016-12-02 MED ORDER — GABAPENTIN 600 MG PO TABS: 600.0000 mg | ORAL_TABLET | Freq: Three times a day (TID) | ORAL | 0 refills | Status: AC

## 2016-12-02 MED ORDER — DOCUSATE SODIUM 100 MG PO CAPS: 100.0000 mg | ORAL_CAPSULE | Freq: Two times a day (BID) | ORAL | Status: DC

## 2016-12-02 MED ORDER — ENOXAPARIN (LOVENOX) PATIENT EDUCATION KIT
PACK | Freq: Once | Status: DC
  Filled 2016-12-02: qty 1

## 2016-12-02 MED ORDER — DOCUSATE SODIUM 100 MG PO CAPS: 100.0000 mg | ORAL_CAPSULE | Freq: Two times a day (BID) | ORAL | 1 refills | Status: AC

## 2016-12-02 MED ORDER — ENOXAPARIN (LOVENOX) PATIENT EDUCATION KIT: 1.0000 | PACK | Freq: Once | 0 refills | Status: AC

## 2016-12-02 MED ORDER — GABAPENTIN 600 MG PO TABS
600.0000 mg | ORAL_TABLET | Freq: Three times a day (TID) | ORAL | Status: DC
Start: 2016-12-02 — End: 2016-12-02

## 2016-12-02 MED ORDER — ENOXAPARIN SODIUM 40 MG/0.4ML ~~LOC~~ SOLN: 40.0000 mg | SUBCUTANEOUS | 0 refills | Status: DC

## 2016-12-02 NOTE — Progress Notes (Signed)
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 Pt's need for continuous pain medications, as well as decreased safety awareness does put him at an increased risk for falls and pain continues to limit the pt's mobility. Pt indicates that he does not feel safe going home at this time, as his wife will not be able to take off work to assist him. Pt requests to be sent to a SNF prior to return home. PT discussed safe home management with patient; however pt continues to request SNF. Will continue to follow for mobilization and increased activity tolerance.    Follow Up Recommendations  SNF     Equipment Recommendations  Rolling walker with 5" wheels    Recommendations for Other Services       Precautions / Restrictions Precautions Precautions: Fall Restrictions Weight Bearing Restrictions: Yes LLE Weight Bearing: Non weight bearing    Mobility  Bed Mobility               General bed mobility comments: Pt in recliner chair upon arrival   Transfers Overall transfer level: Needs assistance   Transfers: Sit to/from Stand Sit to Stand: Min guard         General transfer comment: Min guard for safety. No VCs needed for hand placement or sequencing   Ambulation/Gait Ambulation/Gait assistance: Min guard Ambulation Distance (Feet): 30 Feet Assistive device: Rolling walker (2 wheeled) Gait Pattern/deviations: Step-to pattern;Trunk flexed   Gait velocity interpretation: Below normal speed for age/gender General Gait Details: Pt with increased use of weight through UEs and not able to ambulate far this session  secondary to pain. Min guard for Proofreadersafety    Stairs            Wheelchair Mobility    Modified Rankin (Stroke Patients Only)       Balance Overall balance assessment: Needs assistance Sitting-balance support: No upper extremity supported Sitting balance-Leahy Scale: Good     Standing balance support: Bilateral upper extremity supported;During functional activity Standing balance-Leahy Scale: Poor Standing balance comment: reliant on UEs                             Cognition Arousal/Alertness: Awake/alert Behavior During Therapy: WFL for tasks assessed/performed Overall Cognitive Status: Within Functional Limits for tasks assessed                                        Exercises      General Comments        Pertinent Vitals/Pain Pain Assessment: 0-10 Pain Score: 10-Worst pain ever Pain Location: LLE Pain Descriptors / Indicators: Aching;Sore;Discomfort Pain Intervention(s): Limited activity within patient's tolerance;Monitored during session;Repositioned;Premedicated before session    Home Living                      Prior Function            PT Goals (current goals can now be found in the care plan section) Acute Rehab PT Goals Patient  Stated Goal: decrease pain  PT Goal Formulation: With patient Time For Goal Achievement: 12/16/16 Potential to Achieve Goals: Good Progress towards PT goals: Progressing toward goals    Frequency    Min 3X/week      PT Plan Discharge plan needs to be updated;Frequency needs to be updated    Co-evaluation              AM-PAC PT "6 Clicks" Daily Activity  Outcome Measure  Difficulty turning over in bed (including adjusting bedclothes, sheets and blankets)?: A Little Difficulty moving from lying on back to sitting on the side of the bed? : A Little Difficulty sitting down on and standing up from a chair with arms (e.g., wheelchair, bedside commode, etc,.)?: A  Little Help needed moving to and from a bed to chair (including a wheelchair)?: A Little Help needed walking in hospital room?: A Little Help needed climbing 3-5 steps with a railing? : A Little 6 Click Score: 18    End of Session Equipment Utilized During Treatment: Gait belt Activity Tolerance: Patient limited by pain Patient left: in chair;with call bell/phone within reach Nurse Communication: Mobility status;Patient requests pain meds;Precautions PT Visit Diagnosis: Unsteadiness on feet (R26.81);Other abnormalities of gait and mobility (R26.89);Difficulty in walking, not elsewhere classified (R26.2);Pain Pain - Right/Left: Left Pain - part of body: Leg     Time: 1610-9604 PT Time Calculation (min) (ACUTE ONLY): 25 min  Charges:  $Gait Training: 8-22 mins $Self Care/Home Management: 8-22                    G Codes:       Arneta Cliche, SPT Acute Rehab [phone] 12/02/2016, 9:33 AM

## 2016-12-02 NOTE — Discharge Instructions (Signed)
[facility] Discharge Instructions   General Discharge Instructions  WEIGHT BEARING STATUS: nonweightbearing Left leg   RANGE OF MOTION/ACTIVITY: toe and knee motion as tolerated   Wound Care: daily pinsite care. Ok to shower in fixator. See detailed instructions below    Discharge Pin Site Instructions  Dress pins daily with Kerlix roll starting on POD 2. Wrap the Kerlix so that it tamps the skin down around the pin-skin interface to prevent/limit motion of the skin relative to the pin.  (Pin-skin motion is the primary cause of pain and infection related to external fixator pin sites).  Remove any crust or coagulum that Puff obstruct drainage with a saline moistened gauze or soap and water.  After POD 3, if there is no discernable drainage on the pin site dressing, the interval for change can by increased to every other day.  You Barber shower with the fixator, cleaning all pin sites gently with soap and water.  If you have a surgical wound this needs to be completely dry and without drainage before showering.  The extremity can be lifted by the fixator to facilitate wound care and transfers.  Notify the office/Doctor if you experience increasing drainage, redness, or pain from a pin site, or if you notice purulent (thick, snot-like) drainage.  Discharge Wound Care Instructions  Do NOT apply any ointments, solutions or lotions to pin sites or surgical wounds.  These prevent needed drainage and even though solutions like hydrogen peroxide kill bacteria, they also damage cells lining the pin sites that help fight infection.  Applying lotions or ointments can keep the wounds moist and can cause them to breakdown and open up as well. This can increase the risk for infection. When in doubt call the office.  Surgical incisions should be dressed daily.  If any drainage is noted, use one layer of adaptic, then gauze, Kerlix, and an ace wrap.  Once the incision is completely dry  and without drainage, it Trickel be left open to air out.  Showering Guy[place] begin 36-48 hours later.  Cleaning gently with soap and water.  Traumatic wounds should be dressed daily as well.    One layer of adaptic, gauze, Kerlix, then ace wrap.  The adaptic can be discontinued once the draining has ceased    If you have a wet to dry dressing: wet the gauze with saline the squeeze as much saline out so the gauze is moist (not soaking wet), place moistened gauze over wound, then place a dry gauze over the moist one, followed by Kerlix wrap, then ace wrap.  DVT/PE prophylaxis: Lovenox   Diet: as you were eating previously.  Can use over the counter stool softeners and bowel preparations, such as Miralax, to help with bowel movements.  Narcotics can be constipating.  Be sure to drink plenty of fluids  PAIN MEDICATION USE AND EXPECTATIONS  You have likely been given narcotic medications to help control your pain.  After a traumatic event that results in an fracture (broken bone) with or without surgery, it is ok to use narcotic pain medications to help control one's pain.  We understand that everyone responds to pain differently and each individual patient will be evaluated on a regular basis for the continued need for narcotic medications. Ideally, narcotic medication use should last no more than 6-8 weeks (coinciding with fracture healing).   As a patient it is your responsibility as well to monitor narcotic medication use and report the amount and frequency you use  these medications when you come to your office visit.   We would also advise that if you are using narcotic medications, you should take a dose prior to therapy to maximize you participation.  IF YOU ARE ON NARCOTIC MEDICATIONS IT IS NOT PERMISSIBLE TO OPERATE A MOTOR VEHICLE (MOTORCYCLE/CAR/TRUCK/MOPED) OR HEAVY MACHINERY DO NOT MIX NARCOTICS WITH OTHER CNS (CENTRAL NERVOUS SYSTEM) DEPRESSANTS SUCH AS ALCOHOL   STOP SMOKING OR USING NICOTINE  PRODUCTS!!!!  As discussed nicotine severely impairs your body's ability to heal surgical and traumatic wounds but also impairs bone healing.  Wounds and bone heal by forming microscopic blood vessels (angiogenesis) and nicotine is a vasoconstrictor (essentially, shrinks blood vessels).  Therefore, if vasoconstriction occurs to these microscopic blood vessels they essentially disappear and are unable to deliver necessary nutrients to the healing tissue.  This is one modifiable factor that you can do to dramatically increase your chances of healing your injury.    (This means no smoking, no nicotine gum, patches, etc)  DO NOT USE NONSTEROIDAL ANTI-INFLAMMATORY DRUGS (NSAID'S)  Using products such as Advil (ibuprofen), Aleve (naproxen), Motrin (ibuprofen) for additional pain control during fracture healing can delay and/or prevent the healing response.  If you would like to take over the counter (OTC) medication, Tylenol (acetaminophen) is ok.  However, some narcotic medications that are given for pain control contain acetaminophen as well. Therefore, you should not exceed more than 4000 mg of tylenol in a day if you do not have liver disease.  Also note that there are Lundahl OTC medicines, such as cold medicines and allergy medicines that my contain tylenol as well.  If you have any questions about medications and/or interactions please ask your doctor/PA or your pharmacist.      ICE AND ELEVATE INJURED/OPERATIVE EXTREMITY  Using ice and elevating the injured extremity above your heart can help with swelling and pain control.  Icing in a pulsatile fashion, such as 20 minutes on and 20 minutes off, can be followed.    Do not place ice directly on skin. Make sure there is a barrier between to skin and the ice pack.    Using frozen items such as frozen peas works well as the conform nicely to the are that needs to be iced.  USE AN ACE WRAP OR TED HOSE FOR SWELLING CONTROL  In addition to icing and elevation,  Ace wraps or TED hose are used to help limit and resolve swelling.  It is recommended to use Ace wraps or TED hose until you are informed to stop.    When using Ace Wraps start the wrapping distally (farthest away from the body) and wrap proximally (closer to the body)   Example: If you had surgery on your leg or thing and you do not have a splint on, start the ace wrap at the toes and work your way up to the thigh        If you had surgery on your upper extremity and do not have a splint on, start the ace wrap at your fingers and work your way up to the upper arm  IF YOU ARE IN A SPLINT OR CAST DO NOT REMOVE IT FOR ANY REASON   If your splint gets wet for any reason please contact the office immediately. You Clingerman shower in your splint or cast as long as you keep it dry.  This can be done by wrapping in a cast cover or garbage back (or similar)  Do Not stick  any thing down your splint or cast such as pencils, money, or hangers to try and scratch yourself with.  If you feel itchy take benadryl as prescribed on the bottle for itching  IF YOU ARE IN A CAM BOOT (BLACK BOOT)  You Peine remove boot periodically. Perform daily dressing changes as noted below.  Wash the liner of the boot regularly and wear a sock when wearing the boot. It is recommended that you sleep in the boot until told otherwise  CALL THE OFFICE WITH ANY QUESTIONS OR CONCERNS: [phone]

## 2016-12-02 NOTE — Discharge Summary (Signed)
[facility] (OTS)  Patient ID: Oscar Martinez MRN: [id number] DOB/AGE: Nov 05, 1975 41 y.o.  Admit date: 11/27/2016 Discharge date: 12/02/2016  Admission Diagnoses:  Closed L distal tibia fracture, pilon Closed left fibula fracture  Nicotine dependence  COPD Arthritis   Discharge Diagnoses:  Principal Problem:   Closed fracture of left distal tibia Active Problems:   Closed fracture of left distal fibula   COPD (chronic obstructive pulmonary disease) (HCC)   Arthritis   Nicotine dependence   Procedures Performed:  11/27/2016- Dr. Marcelino Scot  1. Closed manipulation of pilon, tibia. 2. Intramedullary nailing, internal fixation of the left lateral     malleolus. 3. Application of external fixator to right leg and right foot.   Discharged Condition: good  Hospital Course:   41 y/o male admitted on 11/27/2016 after sustaining an injury at work. Pt was taken to the OR on day of admission to address his complex L distal tibia and fibula fracture. Pt hand the procedure noted above performed. After surgery he was taken to the pacu for recovery from anesthesia and then transferred to the ortho floor for observation, pain control and therapies. Pt was covered with ancef for routing abx prophylaxis following an orthopaedic procedure. pts pain control was quite difficulty due to opioid tolerance and nicotine dependence. Pt was started on opioids for back pain several months ago which has made him quite tolerant. Pt began to work with therapies on POD 1. It was determined that he could dc to home as he would have adequate help. Pt continued to progress well with therapies over the next several days. He was started on lovenox for dvt and pe prophylaxis on POD 1 as well.   On POD 5 pt was stable for dc to home. Pt discharged in stable condition. He will follow up at office in 10 days or so to determine timing for next surgery    Multimodal analgesia was used to minimize narcotic  requirements as well   Consults: None  Significant Diagnostic Studies: labs:   Results for CRIS, TALAVERA (MRN [id number]) as of 12/18/2016 21:06  Ref. Range 11/30/2016 04:34  WBC Latest Ref Range: 4.0 - 10.5 K/uL 8.5  RBC Latest Ref Range: 4.22 - 5.81 MIL/uL 4.31  Hemoglobin Latest Ref Range: 13.0 - 17.0 g/dL 12.7 (L)  HCT Latest Ref Range: 39.0 - 52.0 % 37.1 (L)  MCV Latest Ref Range: 78.0 - 100.0 fL 86.1  MCH Latest Ref Range: 26.0 - 34.0 pg 29.5  MCHC Latest Ref Range: 30.0 - 36.0 g/dL 34.2  RDW Latest Ref Range: 11.5 - 15.5 % 12.9  Platelets Latest Ref Range: 150 - 400 K/uL 175    Treatments: IV hydration, antibiotics: Ancef, analgesia: dilaudid, percocet, oxy IR, neurontin , anticoagulation: LMW heparin, therapies: PT, OT and RN and surgery: as above   Discharge Exam:      Orthopedic Trauma Service Progress Note    Patient IDDorsel Flinn Martinez MRN: [id number] DOB/AGE: 02-09-1976 41 y.o.   Subjective:   No acute issues Ready for discharge   Inquired about snf but it sounds like he has adequate help at home and has done well with PT    Again very concerned with the exact medications he will go home with   PTA pt was started on opioids for low back pain by his PCP. He has been on Norco for several months    Appreciate eval by pharm D student. Agree with assessment. However will not initiate Oxycontin for this patients  acute pain.  This will be something he will need to explore with his PCP    I do think that his nicotine use is a big factor his perception of pain as he smokes 1.5-2 PPD. This will continue to exacerbate his pain         Review of Systems  Constitutional: Negative for chills and fever.  Respiratory: Negative for shortness of breath and wheezing.   Cardiovascular: Negative for chest pain and palpitations.  Gastrointestinal: Negative for nausea and vomiting.      Objective:    VITALS:         Vitals:    12/01/16 0614 12/01/16 1415 12/01/16 1700 12/02/16  0451  BP: 133/64 (!) 160/94 (!) 145/88 129/88  Pulse: (!) 102 (!) 105 100 (!) 106  Resp: _0 Temp: 99.4 F (37.4 C) 98.1 F (36.7 C)   98.3 F (36.8 C)  TempSrc: Oral Oral   Oral  SpO2: 94% 98%   99%  Weight:          Height:              Intake/Output      08/13 0701 - 08/14 0700 08/14 0701 - 08/15 0700   P.O. 1440 840   Total Intake(mL/kg) 1440 (15.6) 840 (9.1)   Urine (mL/kg/hr) 1350 (0.6)    Total Output 1350     Net +90 +840        Urine Occurrence 3 x 1 x      LABS   Lab Results Last 24 Hours  No results found for this or any previous visit (from the past 24 hour(s)).       PHYSICAL EXAM:  Gen: sitting in bed, NAD, appears well, leg elevated. Looks very comfortable  Lungs: breathing unlabored Cardiac: regular  Ext:        Left Lower Extremity              Ex fix stable                         All pinsites look great                         expected drainage at calcaneal pinsites              swelling stable              Skin does not wrinkle with gentle compression around the ankle              DPN, SPN, TN sensation intact             EHL, FHL, lesser toe motor grossly intact             Ext warm             + DP pulse             Compartments are soft             No pain out of proportion with passive stretch of toes    Assessment/Plan: 5 Days Post-Op    Principal Problem:   Closed fracture of left distal tibia Active Problems:   Closed fracture of left distal fibula   COPD (chronic obstructive pulmonary disease) (HCC)   Arthritis   Nicotine dependence               Anti-infectives  Start     Dose/Rate Route Frequency Ordered Stop    11/27/16 1930   ceFAZolin (ANCEF) IVPB 1 g/50 mL premix     1 g 100 mL/hr over 30 Minutes Intravenous Every 6 hours 11/27/16 1543 11/28/16 0806     .   POD/HD#: 50   40 y/o male s/p crush by log with left distal tibia fracture    -closed left distal tibia and fibula fracture s/p ex fix               Dc home today                        NWB Left leg               Ok for pt to shower---remove all dressings before going in the shower. Can clean ankle, ex fix with soap and water including pinsites.             New dressing to be applied after pt showers               Aggressive ice and elevation                Reviewed pincare and dressing changes with pt    - Pain management:                 discharge pain medication regimen                         Percocet 10/325 1-2 po q6h prn severe pain                         Oxy IR 5-10 mg po q6h prn breakthrough pain (take between percocet only)                         Robaxin [phone] mg po q6h prn spasms                         neurontin 600 mg po q8h                                                   Local modalities including Ice and elevation                         STOP SMOKING    - ABL anemia/Hemodynamics             Stable   - Medical issues              Nicotine dependence                         No nicotine patches or nicotine products                          Likely contributing to heightened pain response    - DVT/PE prophylaxis:             Lovenox    - ID:              periop abx completed      -  Activity:             NWB left leg    - FEN/GI prophylaxis/Foley/Lines:             Reg diet      -Ex-fix/Splint care:             Ok to manipulate leg by fixator    - Impediments to fracture healing:             Nicotine dependence              Chronic opioid use    - Dispo:             dc home             Follow up with OTS at office on 12/10/2016     Disposition: Home   Discharge Instructions    Call MD / Call 911    Complete by:  As directed    If you experience chest pain or shortness of breath, CALL 911 and be transported to the hospital emergency room.  If you develope a fever above 101 F, pus (white drainage) or increased drainage or redness at the wound, or calf pain, call your surgeon's office.    Constipation Prevention    Complete by:  As directed    Drink plenty of fluids.  Prune juice Morandi be helpful.  You Dimmitt use a stool softener, such as Colace (over the counter) 100 mg twice a day.  Use MiraLax (over the counter) for constipation as needed.   Diet general    Complete by:  As directed    Discharge instructions    Complete by:  As directed    Orthopaedic Trauma Service Discharge Instructions   General Discharge Instructions  WEIGHT BEARING STATUS: nonweightbearing Left leg   RANGE OF MOTION/ACTIVITY: toe and knee motion as tolerated   Wound Care: daily pinsite care. Ok to shower in fixator. See detailed instructions below    Discharge Pin Site Instructions  Dress pins daily with Kerlix roll starting on POD 2. Wrap the Kerlix so that it tamps the skin down around the pin-skin interface to prevent/limit motion of the skin relative to the pin.  (Pin-skin motion is the primary cause of pain and infection related to external fixator pin sites).  Remove any crust or coagulum that Lowrey obstruct drainage with a saline moistened gauze or soap and water.  After POD 3, if there is no discernable drainage on the pin site dressing, the interval for change can by increased to every other day.  You Severin shower with the fixator, cleaning all pin sites gently with soap and water.  If you have a surgical wound this needs to be completely dry and without drainage before showering.  The extremity can be lifted by the fixator to facilitate wound care and transfers.  Notify the office/Doctor if you experience increasing drainage, redness, or pain from a pin site, or if you notice purulent (thick, snot-like) drainage.  Discharge Wound Care Instructions  Do NOT apply any ointments, solutions or lotions to pin sites or surgical wounds.  These prevent needed drainage and even though solutions like hydrogen peroxide kill bacteria, they also damage cells lining the pin sites that help fight  infection.  Applying lotions or ointments can keep the wounds moist and can cause them to breakdown and open up as well. This can increase the risk for infection. When in doubt call the office.  Surgical incisions  should be dressed daily.  If any drainage is noted, use one layer of adaptic, then gauze, Kerlix, and an ace wrap.  Once the incision is completely dry and without drainage, it Ilyas be left open to air out.  Showering Beam begin 36-48 hours later.  Cleaning gently with soap and water.  Traumatic wounds should be dressed daily as well.    One layer of adaptic, gauze, Kerlix, then ace wrap.  The adaptic can be discontinued once the draining has ceased    If you have a wet to dry dressing: wet the gauze with saline the squeeze as much saline out so the gauze is moist (not soaking wet), place moistened gauze over wound, then place a dry gauze over the moist one, followed by Kerlix wrap, then ace wrap.  DVT/PE prophylaxis: Lovenox   Diet: as you were eating previously.  Can use over the counter stool softeners and bowel preparations, such as Miralax, to help with bowel movements.  Narcotics can be constipating.  Be sure to drink plenty of fluids  PAIN MEDICATION USE AND EXPECTATIONS  You have likely been given narcotic medications to help control your pain.  After a traumatic event that results in an fracture (broken bone) with or without surgery, it is ok to use narcotic pain medications to help control one's pain.  We understand that everyone responds to pain differently and each individual patient will be evaluated on a regular basis for the continued need for narcotic medications. Ideally, narcotic medication use should last no more than 6-8 weeks (coinciding with fracture healing).   As a patient it is your responsibility as well to monitor narcotic medication use and report the amount and frequency you use these medications when you come to your office visit.   We would also advise that  if you are using narcotic medications, you should take a dose prior to therapy to maximize you participation.  IF YOU ARE ON NARCOTIC MEDICATIONS IT IS NOT PERMISSIBLE TO OPERATE A MOTOR VEHICLE (MOTORCYCLE/CAR/TRUCK/MOPED) OR HEAVY MACHINERY DO NOT MIX NARCOTICS WITH OTHER CNS (CENTRAL NERVOUS SYSTEM) DEPRESSANTS SUCH AS ALCOHOL   STOP SMOKING OR USING NICOTINE PRODUCTS!!!!  As discussed nicotine severely impairs your body's ability to heal surgical and traumatic wounds but also impairs bone healing.  Wounds and bone heal by forming microscopic blood vessels (angiogenesis) and nicotine is a vasoconstrictor (essentially, shrinks blood vessels).  Therefore, if vasoconstriction occurs to these microscopic blood vessels they essentially disappear and are unable to deliver necessary nutrients to the healing tissue.  This is one modifiable factor that you can do to dramatically increase your chances of healing your injury.    (This means no smoking, no nicotine gum, patches, etc)  DO NOT USE NONSTEROIDAL ANTI-INFLAMMATORY DRUGS (NSAID'S)  Using products such as Advil (ibuprofen), Aleve (naproxen), Motrin (ibuprofen) for additional pain control during fracture healing can delay and/or prevent the healing response.  If you would like to take over the counter (OTC) medication, Tylenol (acetaminophen) is ok.  However, some narcotic medications that are given for pain control contain acetaminophen as well. Therefore, you should not exceed more than 4000 mg of tylenol in a day if you do not have liver disease.  Also note that there are [place] OTC medicines, such as cold medicines and allergy medicines that my contain tylenol as well.  If you have any questions about medications and/or interactions please ask your doctor/PA or your pharmacist.      ICE AND ELEVATE INJURED/OPERATIVE  EXTREMITY  Using ice and elevating the injured extremity above your heart can help with swelling and pain control.  Icing in a pulsatile  fashion, such as 20 minutes on and 20 minutes off, can be followed.    Do not place ice directly on skin. Make sure there is a barrier between to skin and the ice pack.    Using frozen items such as frozen peas works well as the conform nicely to the are that needs to be iced.  USE AN ACE WRAP OR TED HOSE FOR SWELLING CONTROL  In addition to icing and elevation, Ace wraps or TED hose are used to help limit and resolve swelling.  It is recommended to use Ace wraps or TED hose until you are informed to stop.    When using Ace Wraps start the wrapping distally (farthest away from the body) and wrap proximally (closer to the body)   Example: If you had surgery on your leg or thing and you do not have a splint on, start the ace wrap at the toes and work your way up to the thigh        If you had surgery on your upper extremity and do not have a splint on, start the ace wrap at your fingers and work your way up to the upper arm  IF YOU ARE IN A SPLINT OR CAST DO NOT [place]   If your splint gets wet for any reason please contact the office immediately. You Hamza shower in your splint or cast as long as you keep it dry.  This can be done by wrapping in a cast cover or garbage back (or similar)  Do Not stick any thing down your splint or cast such as pencils, money, or hangers to try and scratch yourself with.  If you feel itchy take benadryl as prescribed on the bottle for itching  IF YOU ARE IN A CAM BOOT (BLACK BOOT)  You [place] remove boot periodically. Perform daily dressing changes as noted below.  Wash the liner of the boot regularly and wear a sock when wearing the boot. It is recommended that you sleep in the boot until told otherwise  CALL THE OFFICE WITH ANY QUESTIONS OR CONCERNS: [phone]   Driving restrictions    Complete by:  As directed    No driving   Increase activity slowly as tolerated    Complete by:  As directed    Non weight bearing    Complete by:  As directed     Laterality:  left   Extremity:  Lower     Allergies as of 12/02/2016   No Known Allergies     Medication List    STOP taking these medications   HYDROcodone-acetaminophen 7.5-325 MG tablet Commonly known as:  NORCO     TAKE these medications   albuterol 108 (90 Base) MCG/ACT inhaler Commonly known as:  PROVENTIL HFA;VENTOLIN HFA Inhale 2 puffs into the lungs every 6 (six) hours as needed for wheezing.   docusate sodium 100 MG capsule Commonly known as:  COLACE Take 1 capsule (100 mg total) by mouth 2 (two) times daily.   enoxaparin 40 MG/0.4ML injection Commonly known as:  LOVENOX Inject 0.4 mLs (40 mg total) into the skin daily.   enoxaparin Kit Commonly known as:  LOVENOX 1 kit by Does not apply route once.   gabapentin 600 MG tablet Commonly known as:  NEURONTIN Take 1 tablet (600 mg total) by  mouth 3 (three) times daily.   methocarbamol 500 MG tablet Commonly known as:  ROBAXIN Take 1-2 tablets (500-1,000 mg total) by mouth every 6 (six) hours as needed for muscle spasms.   oxyCODONE 5 MG immediate release tablet Commonly known as:  Oxy IR/ROXICODONE Take 1-2 tablets (5-10 mg total) by mouth every 6 (six) hours as needed for breakthrough pain (give between percocet for breakthrough pain only).   oxyCODONE-acetaminophen 10-325 MG tablet Commonly known as:  PERCOCET Take 1-2 tablets by mouth every 6 (six) hours as needed for pain.   polyethylene glycol packet Commonly known as:  MIRALAX / GLYCOLAX Take 17 g by mouth daily.            Durable Medical Equipment        Start     Ordered   12/02/16 1516  For home use only DME Walker rolling  Once    Question:  Patient needs a walker to treat with the following condition  Answer:  Closed tibia fracture   12/02/16 1518     Follow-up Information    Altamese Arp, MD. Schedule an appointment as soon as possible for a visit on 12/10/2016.   Specialty:  Orthopedic Surgery Contact information: [address]  [place] [address] [phone]           Discharge Instructions and Plan:  41 y/o male s/p crush by log with left distal tibia fracture    -closed left distal tibia and fibula fracture s/p ex fix              Dc home today                        NWB Left leg               Ok for pt to shower---remove all dressings before going in the shower. Can clean ankle, ex fix with soap and water including pinsites.             New dressing to be applied after pt showers               Aggressive ice and elevation                Reviewed pincare and dressing changes with pt    - Pain management:                 discharge pain medication regimen                         Percocet 10/325 1-2 po q6h prn severe pain                         Oxy IR 5-10 mg po q6h prn breakthrough pain (take between percocet only)                         Robaxin [phone] mg po q6h prn spasms                         neurontin 600 mg po q8h  Local modalities including Ice and elevation                         STOP SMOKING    - ABL anemia/Hemodynamics             Stable   - Medical issues              Nicotine dependence                         No nicotine patches or nicotine products                          Likely contributing to heightened pain response    - DVT/PE prophylaxis:             Lovenox    - ID:              periop abx completed      - Activity:             NWB left leg    - FEN/GI prophylaxis/Foley/Lines:             Reg diet      -Ex-fix/Splint care:             Ok to manipulate leg by fixator    - Impediments to fracture healing:             Nicotine dependence              Chronic opioid use    - Dispo:             dc home             Follow up with OTS at office on 12/10/2016  Signed:  Jari Pigg, PA-C [facility] [phone] (P) 12/02/2016, 3:49 PM

## 2016-12-02 NOTE — Progress Notes (Signed)
Orthopedic Trauma Service Progress Note   Patient ID: Oscar Martinez MRN: [id number] DOB/AGE: [birth year] 41 y.o.  Subjective:  No acute issues Ready for discharge  Inquired about snf but it sounds like he has adequate help at home and has done well with PT   Again very concerned with the exact medications he will go home with  PTA pt was started on opioids for low back pain by his PCP. He has been on Norco for several months   Appreciate eval by pharm D student. Agree with assessment. However will not initiate Oxycontin for this patients acute pain.  This will be something he will need to explore with his PCP   I do think that his nicotine use is a big factor his perception of pain as he smokes 1.5-2 PPD. This will continue to exacerbate his pain     Review of Systems  Constitutional: Negative for chills and fever.  Respiratory: Negative for shortness of breath and wheezing.   Cardiovascular: Negative for chest pain and palpitations.  Gastrointestinal: Negative for nausea and vomiting.    Objective:   VITALS:   Vitals:   12/01/16 0614 12/01/16 1415 12/01/16 1700 12/02/16 0451  BP: 133/64 (!) 160/94 (!) 145/88 129/88  Pulse: (!) 102 (!) 105 100 (!) 106  Resp: 16 18  18   Temp: 99.4 F (37.4 C) 98.1 F (36.7 C)  98.3 F (36.8 C)  TempSrc: Oral Oral  Oral  SpO2: 94% 98%  99%  Weight:      Height:        Intake/Output      08/13 0701 - 08/14 0700 08/14 0701 - 08/15 0700   P.O. 1440 840   Total Intake(mL/kg) 1440 (15.6) 840 (9.1)   Urine (mL/kg/hr) 1350 (0.6)    Total Output 1350     Net +90 +840        Urine Occurrence 3 x 1 x     LABS  No results found for this or any previous visit (from the past 24 hour(s)).   PHYSICAL EXAM:  Gen: sitting in bed, NAD, appears well, leg elevated. Looks very comfortable  Lungs: breathing unlabored Cardiac: regular  Ext:        Left Lower Extremity              Ex fix stable    All pinsites look great                         expected drainage at calcaneal pinsites              swelling stable              Skin does not wrinkle with gentle compression around the ankle              DPN, SPN, TN sensation intact             EHL, FHL, lesser toe motor grossly intact             Ext warm             + DP pulse             Compartments are soft             No pain out of proportion with passive stretch of toes   Assessment/Plan: 5 Days Post-Op   Principal Problem:   Closed fracture of left distal tibia Active Problems:  Closed fracture of left distal fibula   COPD (chronic obstructive pulmonary disease) (HCC)   Arthritis   Nicotine dependence   Anti-infectives    Start     Dose/Rate Route Frequency Ordered Stop   11/27/16 1930  ceFAZolin (ANCEF) IVPB 1 g/50 mL premix     1 g 100 mL/hr over 30 Minutes Intravenous Every 6 hours 11/27/16 1543 11/28/16 0806    .  POD/HD#: 72  41 y/o male s/p crush by log with left distal tibia fracture    -closed left distal tibia and fibula fracture s/p ex fix   Dc home today             NWB Left leg              Ok for pt to shower---remove all dressings before going in the shower. Can clean ankle, ex fix with soap and water including pinsites.             New dressing to be applied after pt showers               Aggressive ice and elevation                Reviewed pincare and dressing changes with pt    - Pain management:                 discharge pain medication regimen   Percocet 10/325 1-2 po q6h prn severe pain   Oxy IR 5-10 mg po q6h prn breakthrough pain (take between percocet only)   Robaxin [phone] mg po q6h prn spasms   neurontin 600 mg po q8h       Local modalities including Ice and elevation   STOP SMOKING    - ABL anemia/Hemodynamics             Stable   - Medical issues              Nicotine dependence                         No nicotine patches or nicotine products    Likely  contributing to heightened pain response    - DVT/PE prophylaxis:             Lovenox    - ID:              periop abx completed      - Activity:             NWB left leg    - FEN/GI prophylaxis/Foley/Lines:             Reg diet     -Ex-fix/Splint care:             Ok to manipulate leg by fixator    - Impediments to fracture healing:             Nicotine dependence   Chronic opioid use    - Dispo:             dc home  Follow up with OTS at office on 12/10/2016   Mearl Latin, PA-C [facility] [phone] (P) [phone] (O) 12/02/2016, 3:25 PM

## 2016-12-02 NOTE — Progress Notes (Signed)
Director entered pt's room to find patient had bed up in highest position with knee sharply gatched. Director advised pt that it was not safe to have bed in this position and returned the bed to standard position/height.

## 2016-12-02 NOTE — Progress Notes (Signed)
PA in and orders received to d/c pt home with family. Discharge orders reviewed with pt along with rx. Will instruct pt on how to deliver lovenox injections as ordered . Pt is to return to Ortho office for follow up and determine date of surgery and removal of external fixator.  All personal belongings with pt. No distress , no c/o.  Walker delivered to pt room for home use.

## 2016-12-02 NOTE — Care Management (Addendum)
Patient will discharge home today, CM has requested rolling walker, has notified Adjuster-Earleas Toni ArthursFuller of discharge plan, patient will return for surgery at a later date.

## 2016-12-02 NOTE — Social Work (Signed)
Patient will DC home today.  CSW will sign off for now as social work intervention is no longer needed. Please consult us again if new need arises.  Keene BreathPatricia Jaia Alonge, LCSW Clinical Social Worker [phone]

## 2016-12-02 NOTE — Progress Notes (Signed)
Upon entering pts room found that pt had again placed his bed in high position, with head down and knee gatch up. Advised pt that his bed had just been returned to corect position by the director and he could not recall that she had been in. This scriber placed bed back in correct position.

## 2016-12-02 NOTE — Care Management Note (Signed)
Case Management Note  Patient Details  Name: Oscar Martinez MRN: [id number] Date of Birth: Jun 06, 1975  Subjective/Objective:     41 yr old male s/p work related injury, log hit him on the head resulting in left left tibia fracture.                Action/Plan: Patient had external fixator placed 11/27/16, has too much swelling of lower extremity to have sugical repair. Plan is for patient to return to OR next week. Patient is under worker's comp. His adjuster is Lamar BenesEarleas Fuller [id number]. His claim number is [id number] [place]. Patient [name] need to go to SNF, CM will provide this information to Child psychotherapistsocial worker.   Expected Discharge Date:   12/03/16               Expected Discharge Plan:   pending  In-House Referral:     Discharge planning Services  CM Consult  Post Acute Care Choice:    Choice offered to:     DME Arranged:    DME Agency:     HH Arranged:    HH Agency:     Status of Service:  In process, will continue to follow  If discussed at Long Length of Stay Meetings, dates discussed:    Additional Comments:  Durenda GuthrieBrady, Everli Rother Naomi, RN 12/02/2016, 12:42 PM

## 2016-12-02 NOTE — Progress Notes (Signed)
As of this current timeframe patient has called this RN 18 times this shift for pain medications.  Started early this am coaching pt to focus on something other than when he could receive his next pain med. Therapy in with pt and agreed that pt needed to focus on rehab, being up in chair, as opposed to questioning when his next dose of pain med would be due.  Please see MAR for number of PRN pain meds this pt has received. Patient has requested IV dilaudid to be given in conjunction with Oxy IR , this was not honored.

## 2016-12-02 NOTE — Evaluation (Signed)
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 Pt educated on LB dressing with external fixator and NWB status. Pt demonstrating understanding and required Min A to don pants over external fixator. Answered pt's questions in preparation for dc today.     Follow Up Recommendations  No OT follow up;Supervision - Intermittent    Equipment Recommendations  3 in 1 bedside commode;Tub/shower bench (pending weight bearing status)    Recommendations for Other Services       Precautions / Restrictions Precautions Precautions: Fall Restrictions Weight Bearing Restrictions: Yes LLE Weight Bearing: Non weight bearing      Mobility Bed Mobility Overal bed mobility: Modified Independent             General bed mobility comments: Increased time  Transfers Overall transfer level: Needs assistance Equipment used: None Transfers: Sit to/from Stand Sit to Stand: Min guard         General transfer comment: Min guard for safety. No VCs needed for hand placement or sequencing     Balance Overall balance assessment: Needs assistance Sitting-balance support: No upper extremity supported Sitting balance-Leahy Scale: Good     Standing balance support: Bilateral upper extremity supported;During functional activity Standing balance-Leahy Scale: Poor Standing balance comment: reliant on UEs                            ADL either performed or assessed with clinical judgement   ADL Overall ADL's : Needs assistance/impaired                 Upper Body Dressing : Set up;Sitting Upper Body Dressing Details (indicate cue type  and reason): Donned shirt Lower Body Dressing: Minimal assistance;Sit to/from stand Lower Body Dressing Details (indicate cue type and reason): Educated pt on good clothes for LB dressing. Pt donned pants with Min A over external fixator. Pt maintainign NWB status throughout session.             Functional mobility during ADLs: Min guard;Rolling walker (sit<>Stand) General ADL Comments: Educated pt on LB dressing. Answered pt questions in preparation for dc today.     Vision         Perception     Praxis      Pertinent Vitals/Pain Pain Assessment: Faces Faces Pain Scale: Hurts even more Pain Location: LLE Pain Descriptors / Indicators: Aching;Sore;Discomfort Pain Intervention(s): Monitored during session;Limited activity within patient's tolerance;Repositioned     Hand Dominance     Extremity/Trunk Assessment             Communication     Cognition Arousal/Alertness: Awake/alert Behavior During Therapy: WFL for tasks assessed/performed Overall Cognitive Status: Within Functional Limits for tasks assessed                                 General Comments: Pt having difficulty problem solving and following commands. suspect due to medication   General Comments  Pt sister arrived at end of session for dc    Exercises     Shoulder Instructions      Home Living  Prior Functioning/Environment                   OT Problem List:        OT Treatment/Interventions:      OT Goals(Current goals can be found in the care plan section) Acute Rehab OT Goals Patient Stated Goal: decrease pain  OT Goal Formulation: With patient/family Time For Goal Achievement: 12/12/16 Potential to Achieve Goals: Good ADL Goals Pt Will Perform Lower Body Bathing: with supervision;sit to/from stand Pt Will Perform Lower Body Dressing: with supervision;sit to/from stand Pt Will Transfer to Toilet: with  supervision;ambulating;regular height toilet Pt Will Perform Toileting - Clothing Manipulation and hygiene: with supervision;sit to/from stand Pt Will Perform Tub/Shower Transfer: Tub transfer;with supervision;ambulating;rolling walker (shower chair vs. tub bench)  OT Frequency: Min 2X/week   Barriers to D/C:            Co-evaluation              AM-PAC PT "6 Clicks" Daily Activity     Outcome Measure Help from another person eating meals?: None Help from another person taking care of personal grooming?: None Help from another person toileting, which includes using toliet, bedpan, or urinal?: A Little Help from another person bathing (including washing, rinsing, drying)?: A Little Help from another person to put on and taking off regular upper body clothing?: None Help from another person to put on and taking off regular lower body clothing?: A Little 6 Click Score: 21   End of Session Equipment Utilized During Treatment: Gait belt;Rolling walker Nurse Communication: Mobility status  Activity Tolerance: Patient tolerated treatment well Patient left: with call bell/phone within reach;in bed;with family/visitor present  OT Visit Diagnosis: Other abnormalities of gait and mobility (R26.89);Pain Pain - Right/Left: Left Pain - part of body: Leg                Time: 6962-9528 OT Time Calculation (min): 14 min Charges:  OT General Charges $OT Visit: 1 Procedure OT Treatments $Self Care/Home Management : 8-22 mins G-Codes:     Kyriakos Babler MSOT, OTR/L Acute Rehab Pager: [phone] Office: [phone]  Theodoro Grist Orby Tangen 12/02/2016, 4:34 PM

## 2016-12-08 ENCOUNTER — Emergency Department (HOSPITAL_COMMUNITY)
Admission: EM | Admit: 2016-12-08 | Discharge: 2016-12-08 | Disposition: A | Discharge status: Home / Self Care | Payer: Worker's Compensation | Attending: Emergency Medicine | Admitting: Emergency Medicine

## 2016-12-08 ENCOUNTER — Encounter (HOSPITAL_COMMUNITY): Payer: Self-pay | Admitting: Emergency Medicine

## 2016-12-08 DIAGNOSIS — M79605 Pain in left leg: Secondary | ICD-10-CM

## 2016-12-08 DIAGNOSIS — F17200 Nicotine dependence, unspecified, uncomplicated: Secondary | ICD-10-CM | POA: Diagnosis not present

## 2016-12-08 DIAGNOSIS — J449 Chronic obstructive pulmonary disease, unspecified: Secondary | ICD-10-CM | POA: Insufficient documentation

## 2016-12-08 DIAGNOSIS — Z79899 Other long term (current) drug therapy: Secondary | ICD-10-CM | POA: Insufficient documentation

## 2016-12-08 NOTE — ED Provider Notes (Signed)
[facility]-EMERGENCY DEPT Provider Note   CSN: [id number] Arrival date & time: 12/08/16  0035     History   Chief Complaint Chief Complaint  Patient presents with  . Leg Pain    Surgical Site Pain    HPI Oscar Martinez is a 41 y.o. male.  The history is provided by the patient and medical records.    41 y.o. M with hx of arthritis, COPD, closed fracture of left distal tibia/fibula on 11/27/16 with internal fixation of left ankle and external fixator placement of fibula, presenting to the ED for left leg pain.  Patient reports he was trying to get to the bathroom and he accidentally struck his external fixator on the wall.  No fall to the ground, head injury, or LOC. Patient reports increased left leg pain afterwards.  States he has run out of his narcotic pain medication at home and needs a refill.  States he is not due for follow-up for another 3 days.  Past Medical History:  Diagnosis Date  . Arthritis   . Closed fracture of left distal tibia 11/27/2016  . COPD (chronic obstructive pulmonary disease) (HCC)   . Nicotine dependence 11/28/2016    Patient Active Problem List   Diagnosis Date Noted  . Closed fracture of left distal fibula 11/28/2016  . Nicotine dependence 11/28/2016  . COPD (chronic obstructive pulmonary disease) (HCC)   . Arthritis   . Closed fracture of left distal tibia 11/27/2016    Past Surgical History:  Procedure Laterality Date  . EXTERNAL FIXATION LEG Left 11/27/2016   Procedure: EXTERNAL FIXATION POSSIBLE REPAIR OF LEFT TIBIA AND FIBULA FRACTURES;  Surgeon: Myrene Galas, MD;  Location: [facility] OR;  Service: Orthopedics;  Laterality: Left;  . TOOTH EXTRACTION         Home Medications    Prior to Admission medications   Medication Sig Start Date End Date Taking? Authorizing Provider  albuterol (PROVENTIL HFA;VENTOLIN HFA) 108 (90 Base) MCG/ACT inhaler Inhale 2 puffs into the lungs every 6 (six) hours as needed for wheezing. 12/04/15 12/03/16  [provider]  docusate sodium (COLACE) 100 MG capsule Take 1 capsule (100 mg total) by mouth 2 (two) times daily. 12/02/16   Montez Morita, PA-C  enoxaparin (LOVENOX) 40 MG/0.4ML injection Inject 0.4 mLs (40 mg total) into the skin daily. 12/03/16   Montez Morita, PA-C  gabapentin (NEURONTIN) 600 MG tablet Take 1 tablet (600 mg total) by mouth 3 (three) times daily. 12/02/16   Montez Morita, PA-C  methocarbamol (ROBAXIN) 500 MG tablet Take 1-2 tablets (500-1,000 mg total) by mouth every 6 (six) hours as needed for muscle spasms. 12/02/16   Montez Morita, PA-C  oxyCODONE (OXY IR/ROXICODONE) 5 MG immediate release tablet Take 1-2 tablets (5-10 mg total) by mouth every 6 (six) hours as needed for breakthrough pain (give between percocet for breakthrough pain only). 12/02/16   Montez Morita, PA-C  oxyCODONE-acetaminophen (PERCOCET) 10-325 MG tablet Take 1-2 tablets by mouth every 6 (six) hours as needed for pain. 12/02/16   Montez Morita, PA-C  polyethylene glycol [facility] Ethelene Hal) packet Take 17 g by mouth daily. 12/02/16   Montez Morita, PA-C    Family History No family history on file.  Social History Social History  Substance Use Topics  . Smoking status: Current Every Day Smoker    Packs/day: 1.50  . Smokeless tobacco: Never Used  . Alcohol use No     Allergies   Patient has no known allergies.   Review of  Systems Review of Systems  Musculoskeletal: Positive for arthralgias.  All other systems reviewed and are negative.    Physical Exam Updated Vital Signs BP (!) 133/93 (BP Location: Left Arm)   Pulse 94   Temp 98.1 F (36.7 C) (Oral)   Resp 18   Ht 5\' 9"  (1.753 m)   Wt 92.1 kg (203 lb)   SpO2 95%   BMI 29.98 kg/m   Physical Exam  Constitutional: He is oriented to person, place, and time. He appears well-developed and well-nourished.  HENT:  Head: Normocephalic and atraumatic.  Mouth/Throat: Oropharynx is clear and moist.  Eyes: Pupils are equal, round, and reactive to light.  Conjunctivae and EOM are normal.  Neck: Normal range of motion.  Cardiovascular: Normal rate, regular rhythm and normal heart sounds.   Pulmonary/Chest: Effort normal and breath sounds normal.  Abdominal: Soft. Bowel sounds are normal.  Musculoskeletal: Normal range of motion.  Left lower leg with external fixator in place; skin surrounding nail insertion sites appears clean, no bleeding; no bruising or other signs of acute trauma to the leg; no calf tenderness or swelling; toes are warm and well perfused, normal cap refill; normal sensation  Neurological: He is alert and oriented to person, place, and time.  Skin: Skin is warm and dry.  Psychiatric: He has a normal mood and affect.  Nursing note and vitals reviewed.    ED Treatments / Results  Labs (all labs ordered are listed, but only abnormal results are displayed) Labs Reviewed - No data to display  EKG  EKG Interpretation None       Radiology No results found.  Procedures Procedures (including critical care time)  Medications Ordered in ED Medications - No data to display   Initial Impression / Assessment and Plan / ED Course  I have reviewed the triage vital signs and the nursing notes.  Pertinent labs & imaging results that were available during my care of the patient were reviewed by me and considered in my medical decision making (see chart for details).  41 y.o. M here with left leg pain.  Hit his externa fixator on wall while trying to get to the bathroom.  Hx of distal tib/fib fracture on 11/27/16, repaired by Dr. Carola Frost.  Reports he is out of his home medications.  On exam, no acute deformities noted.  No swelling.  External fixator appears in place.  No signs/symptoms concerning for compartment syndrome.  No findings on exam suggestive of new fracture.  On review of [place] controlled substance database, patient received script for norco 7.5-325 #90 on 11/12/16 followed by script for percocet 10-325 #60 and oxycodone  5mg  #30 on 12/02/16.  Advised patient he will not be receiving further narcotics here in the ED.  Advised to call his orthopedist in the morning if he feels he needs an earlier appt.  Can take tylenol or motrin in the interim.  Final Clinical Impressions(s) / ED Diagnoses   Final diagnoses:  Left leg pain    New Prescriptions New Prescriptions   No medications on file     Oletha Blend 12/08/16 3254    Glynn Octave, MD 12/08/16 [id number]

## 2016-12-08 NOTE — ED Triage Notes (Signed)
Pt is from home via [facility] and had left leg surgery for a fracture tib/fib on 8/9. Pt presents today for pain to the left leg after striking it on same. Pt states he has run out of all his pain medications and doesn't see the ortho doc on Wednesday of this week. Pt here for leg pain.

## 2016-12-08 NOTE — Discharge Instructions (Signed)
You can take tylenol or motrin for your pain. Follow-up with Dr. Carola Frost.  Call in the morning when his office opens to see if you can get an earlier appt. Return here for new concerns.

## 2016-12-11 ENCOUNTER — Encounter (HOSPITAL_COMMUNITY): Payer: Self-pay | Admitting: *Deleted

## 2016-12-11 NOTE — Progress Notes (Signed)
Pt denies SOB, chest pain, and being under the care of a cardiologist. Pt denies having a stress test, echo and cardiac cath. Pt denies having a chest x ray within the last year. Pt made aware to stop taking Aspirin, vitamins, fish oil and herbal medications. Do not take any NSAIDs ie: Ibuprofen, Advil, Naproxen (Aleve), Motrin , BC and Goody Powder or any medication containing Aspirin. Pt stated that he has only 3 doses of Lovenox remaining. EKG tracing requested from [facility]. Pt verbalized understanding of all pre-op instructions.

## 2016-12-16 ENCOUNTER — Ambulatory Visit (HOSPITAL_COMMUNITY)
Admission: RE | Admit: 2016-12-16 | Discharge: 2016-12-16 | Disposition: A | Discharge status: Home / Self Care | Payer: Worker's Compensation | Source: Ambulatory Visit | Attending: Orthopedic Surgery | Admitting: Orthopedic Surgery

## 2016-12-16 ENCOUNTER — Encounter (HOSPITAL_COMMUNITY): Payer: Self-pay | Admitting: Orthopedic Surgery

## 2016-12-16 ENCOUNTER — Encounter (HOSPITAL_COMMUNITY)
Admission: RE | Disposition: A | Discharge status: Home / Self Care | Payer: Self-pay | Source: Ambulatory Visit | Attending: Orthopedic Surgery

## 2016-12-16 ENCOUNTER — Inpatient Hospital Stay (HOSPITAL_COMMUNITY): Payer: Worker's Compensation | Admitting: Anesthesiology

## 2016-12-16 ENCOUNTER — Ambulatory Visit (HOSPITAL_COMMUNITY): Payer: Worker's Compensation

## 2016-12-16 ENCOUNTER — Inpatient Hospital Stay (HOSPITAL_COMMUNITY): Payer: Worker's Compensation

## 2016-12-16 DIAGNOSIS — J449 Chronic obstructive pulmonary disease, unspecified: Secondary | ICD-10-CM | POA: Diagnosis not present

## 2016-12-16 DIAGNOSIS — W228XXA Striking against or struck by other objects, initial encounter: Secondary | ICD-10-CM | POA: Diagnosis not present

## 2016-12-16 DIAGNOSIS — S82872A Displaced pilon fracture of left tibia, initial encounter for closed fracture: Secondary | ICD-10-CM | POA: Insufficient documentation

## 2016-12-16 DIAGNOSIS — F17210 Nicotine dependence, cigarettes, uncomplicated: Secondary | ICD-10-CM | POA: Diagnosis not present

## 2016-12-16 DIAGNOSIS — M1990 Unspecified osteoarthritis, unspecified site: Secondary | ICD-10-CM | POA: Diagnosis not present

## 2016-12-16 DIAGNOSIS — Z8781 Personal history of (healed) traumatic fracture: Secondary | ICD-10-CM

## 2016-12-16 DIAGNOSIS — Z419 Encounter for procedure for purposes other than remedying health state, unspecified: Secondary | ICD-10-CM

## 2016-12-16 DIAGNOSIS — Z9889 Other specified postprocedural states: Secondary | ICD-10-CM

## 2016-12-16 HISTORY — PX: OPEN REDUCTION INTERNAL FIXATION (ORIF) TIBIA/FIBULA FRACTURE: SHX5992

## 2016-12-16 LAB — CBC WITH DIFFERENTIAL/PLATELET
BASOS ABS: 0.1 10*3/uL (ref 0.0–0.1)
BASOS PCT: 1 %
EOS ABS: 0.2 10*3/uL (ref 0.0–0.7)
EOS PCT: 3 %
HEMATOCRIT: 43.8 % (ref 39.0–52.0)
Hemoglobin: 15.1 g/dL (ref 13.0–17.0)
Lymphocytes Relative: 34 %
Lymphs Abs: 2.3 10*3/uL (ref 0.7–4.0)
MCH: 29.7 pg (ref 26.0–34.0)
MCHC: 34.5 g/dL (ref 30.0–36.0)
MCV: 86.2 fL (ref 78.0–100.0)
MONO ABS: 0.3 10*3/uL (ref 0.1–1.0)
Monocytes Relative: 4 %
Neutro Abs: 4 10*3/uL (ref 1.7–7.7)
Neutrophils Relative %: 58 %
PLATELETS: 204 10*3/uL (ref 150–400)
RBC: 5.08 MIL/uL (ref 4.22–5.81)
RDW: 12.8 % (ref 11.5–15.5)
WBC: 6.8 10*3/uL (ref 4.0–10.5)

## 2016-12-16 LAB — PROTIME-INR
INR: 0.94
PROTHROMBIN TIME: 12.5 s (ref 11.4–15.2)

## 2016-12-16 LAB — COMPREHENSIVE METABOLIC PANEL
ALT: 24 U/L (ref 17–63)
AST: 20 U/L (ref 15–41)
Albumin: 4 g/dL (ref 3.5–5.0)
Alkaline Phosphatase: 59 U/L (ref 38–126)
Anion gap: 7 (ref 5–15)
BILIRUBIN TOTAL: 0.6 mg/dL (ref 0.3–1.2)
BUN: 9 mg/dL (ref 6–20)
CHLORIDE: 102 mmol/L (ref 101–111)
CO2: 27 mmol/L (ref 22–32)
CREATININE: 0.75 mg/dL (ref 0.61–1.24)
Calcium: 9.5 mg/dL (ref 8.9–10.3)
Glucose, Bld: 91 mg/dL (ref 65–99)
POTASSIUM: 4 mmol/L (ref 3.5–5.1)
Sodium: 136 mmol/L (ref 135–145)
TOTAL PROTEIN: 7.3 g/dL (ref 6.5–8.1)

## 2016-12-16 LAB — RAPID URINE DRUG SCREEN, HOSP PERFORMED
Amphetamines: NOT DETECTED
BARBITURATES: NOT DETECTED
BENZODIAZEPINES: NOT DETECTED
COCAINE: NOT DETECTED
Opiates: POSITIVE — AB
TETRAHYDROCANNABINOL: NOT DETECTED

## 2016-12-16 LAB — URINALYSIS, ROUTINE W REFLEX MICROSCOPIC
BILIRUBIN URINE: NEGATIVE
GLUCOSE, UA: NEGATIVE mg/dL
HGB URINE DIPSTICK: NEGATIVE
KETONES UR: NEGATIVE mg/dL
LEUKOCYTES UA: NEGATIVE
Nitrite: NEGATIVE
PROTEIN: NEGATIVE mg/dL
Specific Gravity, Urine: 1.016 (ref 1.005–1.030)
pH: 6 (ref 5.0–8.0)

## 2016-12-16 LAB — APTT: aPTT: 31 seconds (ref 24–36)

## 2016-12-16 SURGERY — OPEN REDUCTION INTERNAL FIXATION (ORIF) TIBIA/FIBULA FRACTURE
Anesthesia: Regional | Laterality: Left

## 2016-12-16 MED ORDER — FENTANYL CITRATE (PF) 250 MCG/5ML IJ SOLN
INTRAMUSCULAR | Status: AC
  Filled 2016-12-16: qty 5

## 2016-12-16 MED ORDER — ONDANSETRON HCL 4 MG/2ML IJ SOLN
INTRAMUSCULAR | Status: DC | PRN
Start: 2016-12-16 — End: 2016-12-16
  Administered 2016-12-16: 4 mg via INTRAVENOUS

## 2016-12-16 MED ORDER — KETOROLAC TROMETHAMINE 30 MG/ML IJ SOLN
INTRAMUSCULAR | Status: AC
  Filled 2016-12-16: qty 1

## 2016-12-16 MED ORDER — METHOCARBAMOL 500 MG PO TABS: 500.0000 mg | ORAL_TABLET | Freq: Four times a day (QID) | ORAL | 0 refills | Status: AC | PRN

## 2016-12-16 MED ORDER — LACTATED RINGERS IV SOLN
INTRAVENOUS | Status: DC
  Administered 2016-12-16 (×3): via INTRAVENOUS

## 2016-12-16 MED ORDER — DEXAMETHASONE SODIUM PHOSPHATE 10 MG/ML IJ SOLN: INTRAMUSCULAR | Status: DC | PRN

## 2016-12-16 MED ORDER — MIDAZOLAM HCL 2 MG/2ML IJ SOLN
INTRAMUSCULAR | Status: AC
  Filled 2016-12-16: qty 2

## 2016-12-16 MED ORDER — OXYCODONE-ACETAMINOPHEN 5-325 MG PO TABS
1.0000 | ORAL_TABLET | Freq: Once | ORAL | Status: AC
  Administered 2016-12-16: 1 via ORAL

## 2016-12-16 MED ORDER — ROPIVACAINE HCL 7.5 MG/ML IJ SOLN
INTRAMUSCULAR | Status: DC | PRN
  Administered 2016-12-16: 30 mL via PERINEURAL

## 2016-12-16 MED ORDER — MIDAZOLAM HCL 2 MG/2ML IJ SOLN
INTRAMUSCULAR | Status: AC
  Administered 2016-12-16: 2 mg via INTRAVENOUS
  Filled 2016-12-16: qty 2

## 2016-12-16 MED ORDER — ROCURONIUM BROMIDE 100 MG/10ML IV SOLN
INTRAVENOUS | Status: DC | PRN
  Administered 2016-12-16 (×2): 10 mg via INTRAVENOUS
  Administered 2016-12-16: 50 mg via INTRAVENOUS
  Administered 2016-12-16 (×3): 10 mg via INTRAVENOUS

## 2016-12-16 MED ORDER — MIDAZOLAM HCL 2 MG/2ML IJ SOLN
2.0000 mg | Freq: Once | INTRAMUSCULAR | Status: AC
  Administered 2016-12-16: 2 mg via INTRAVENOUS

## 2016-12-16 MED ORDER — ENOXAPARIN SODIUM 40 MG/0.4ML ~~LOC~~ SOLN: 40.0000 mg | SUBCUTANEOUS | 0 refills | Status: AC

## 2016-12-16 MED ORDER — 0.9 % SODIUM CHLORIDE (POUR BTL) OPTIME
TOPICAL | Status: DC | PRN
  Administered 2016-12-16: 1000 mL

## 2016-12-16 MED ORDER — OXYCODONE-ACETAMINOPHEN 10-325 MG PO TABS: 1.0000 | ORAL_TABLET | Freq: Four times a day (QID) | ORAL | 0 refills | Status: AC | PRN

## 2016-12-16 MED ORDER — OXYCODONE-ACETAMINOPHEN 5-325 MG PO TABS
ORAL_TABLET | ORAL | Status: AC
  Administered 2016-12-16: 1 via ORAL
  Filled 2016-12-16: qty 1

## 2016-12-16 MED ORDER — FENTANYL CITRATE (PF) 100 MCG/2ML IJ SOLN
100.0000 ug | Freq: Once | INTRAMUSCULAR | Status: AC
  Administered 2016-12-16: 100 ug via INTRAVENOUS

## 2016-12-16 MED ORDER — LIDOCAINE HCL (CARDIAC) 20 MG/ML IV SOLN
INTRAVENOUS | Status: DC | PRN
  Administered 2016-12-16: 80 mg via INTRAVENOUS

## 2016-12-16 MED ORDER — MIDAZOLAM HCL 5 MG/5ML IJ SOLN
INTRAMUSCULAR | Status: DC | PRN
  Administered 2016-12-16: 2 mg via INTRAVENOUS

## 2016-12-16 MED ORDER — FENTANYL CITRATE (PF) 100 MCG/2ML IJ SOLN
25.0000 ug | INTRAMUSCULAR | Status: DC | PRN
  Administered 2016-12-16: 50 ug via INTRAVENOUS

## 2016-12-16 MED ORDER — PROPOFOL 10 MG/ML IV BOLUS
INTRAVENOUS | Status: DC | PRN
  Administered 2016-12-16: 200 mg via INTRAVENOUS

## 2016-12-16 MED ORDER — FENTANYL CITRATE (PF) 100 MCG/2ML IJ SOLN
INTRAMUSCULAR | Status: DC | PRN
  Administered 2016-12-16 (×5): 50 ug via INTRAVENOUS

## 2016-12-16 MED ORDER — CHLORHEXIDINE GLUCONATE 4 % EX LIQD: 60.0000 mL | Freq: Once | CUTANEOUS | Status: DC

## 2016-12-16 MED ORDER — SUGAMMADEX SODIUM 200 MG/2ML IV SOLN
INTRAVENOUS | Status: DC | PRN
  Administered 2016-12-16: 200 mg via INTRAVENOUS

## 2016-12-16 MED ORDER — PROPOFOL 10 MG/ML IV BOLUS
INTRAVENOUS | Status: AC
  Filled 2016-12-16: qty 40

## 2016-12-16 MED ORDER — OXYCODONE HCL 5 MG PO TABS: 5.0000 mg | ORAL_TABLET | Freq: Four times a day (QID) | ORAL | 0 refills | Status: AC | PRN

## 2016-12-16 MED ORDER — DEXAMETHASONE SODIUM PHOSPHATE 10 MG/ML IJ SOLN
INTRAMUSCULAR | Status: DC | PRN
  Administered 2016-12-16: 10 mg via INTRAVENOUS

## 2016-12-16 MED ORDER — CEFAZOLIN SODIUM-DEXTROSE 2-3 GM-% IV SOLR
INTRAVENOUS | Status: DC | PRN
  Administered 2016-12-16: 2 g via INTRAVENOUS

## 2016-12-16 MED ORDER — CEFAZOLIN SODIUM-DEXTROSE 2-4 GM/100ML-% IV SOLN: 2.0000 g | INTRAVENOUS | Status: DC

## 2016-12-16 MED ORDER — FENTANYL CITRATE (PF) 100 MCG/2ML IJ SOLN
INTRAMUSCULAR | Status: AC
  Administered 2016-12-16: 100 ug via INTRAVENOUS
  Filled 2016-12-16: qty 2

## 2016-12-16 MED ORDER — BUPIVACAINE-EPINEPHRINE (PF) 0.5% -1:200000 IJ SOLN
INTRAMUSCULAR | Status: DC | PRN
  Administered 2016-12-16: 15 mL via PERINEURAL

## 2016-12-16 MED ORDER — CEFAZOLIN SODIUM-DEXTROSE 2-4 GM/100ML-% IV SOLN
INTRAVENOUS | Status: AC
  Filled 2016-12-16: qty 100

## 2016-12-16 MED ORDER — FENTANYL CITRATE (PF) 100 MCG/2ML IJ SOLN
INTRAMUSCULAR | Status: AC
  Filled 2016-12-16: qty 2

## 2016-12-16 MED ORDER — KETOROLAC TROMETHAMINE 30 MG/ML IJ SOLN
30.0000 mg | Freq: Once | INTRAMUSCULAR | Status: AC
  Administered 2016-12-16: 30 mg via INTRAVENOUS

## 2016-12-16 SURGICAL SUPPLY — 66 items
BANDAGE ACE 4X5 VEL STRL LF (GAUZE/BANDAGES/DRESSINGS) IMPLANT
BANDAGE ACE 6X5 VEL STRL LF (GAUZE/BANDAGES/DRESSINGS) ×2 IMPLANT
BANDAGE ESMARK 6X9 LF (GAUZE/BANDAGES/DRESSINGS) ×1 IMPLANT
BIT DRILL 2.5X2.75 QC CALB (BIT) ×2 IMPLANT
BIT DRILL 3.5X5.5 QC CALB (BIT) ×2 IMPLANT
BNDG ESMARK 6X9 LF (GAUZE/BANDAGES/DRESSINGS) ×2
BNDG GAUZE ELAST 4 BULKY (GAUZE/BANDAGES/DRESSINGS) ×2 IMPLANT
BRUSH SCRUB SURG 4.25 DISP (MISCELLANEOUS) ×4 IMPLANT
COVER MAYO STAND STRL (DRAPES) ×2 IMPLANT
COVER SURGICAL LIGHT HANDLE (MISCELLANEOUS) ×2 IMPLANT
DRAPE C-ARM 42X72 X-RAY (DRAPES) IMPLANT
DRAPE C-ARMOR (DRAPES) ×4 IMPLANT
DRAPE HALF SHEET 40X57 (DRAPES) ×2 IMPLANT
DRAPE U-SHAPE 47X51 STRL (DRAPES) ×2 IMPLANT
DRSG EMULSION OIL 3X3 NADH (GAUZE/BANDAGES/DRESSINGS) IMPLANT
DRSG MEPITEL 4X7.2 (GAUZE/BANDAGES/DRESSINGS) ×2 IMPLANT
ELECT REM PT RETURN 9FT ADLT (ELECTROSURGICAL) ×2
ELECTRODE REM PT RTRN 9FT ADLT (ELECTROSURGICAL) ×1 IMPLANT
GAUZE SPONGE 4X4 12PLY STRL (GAUZE/BANDAGES/DRESSINGS) IMPLANT
GAUZE SPONGE 4X4 12PLY STRL LF (GAUZE/BANDAGES/DRESSINGS) ×2 IMPLANT
GLOVE BIO SURGEON STRL SZ7.5 (GLOVE) ×2 IMPLANT
GLOVE BIO SURGEON STRL SZ8 (GLOVE) ×2 IMPLANT
GLOVE BIOGEL PI IND STRL 7.5 (GLOVE) ×1 IMPLANT
GLOVE BIOGEL PI IND STRL 8 (GLOVE) ×1 IMPLANT
GLOVE BIOGEL PI INDICATOR 7.5 (GLOVE) ×1
GLOVE BIOGEL PI INDICATOR 8 (GLOVE) ×1
GOWN STRL REUS W/ TWL LRG LVL3 (GOWN DISPOSABLE) ×2 IMPLANT
GOWN STRL REUS W/ TWL XL LVL3 (GOWN DISPOSABLE) ×1 IMPLANT
GOWN STRL REUS W/TWL LRG LVL3 (GOWN DISPOSABLE) ×2
GOWN STRL REUS W/TWL XL LVL3 (GOWN DISPOSABLE) ×1
KIT BASIN OR (CUSTOM PROCEDURE TRAY) ×2 IMPLANT
KIT ROOM TURNOVER OR (KITS) ×2 IMPLANT
MANIFOLD NEPTUNE II (INSTRUMENTS) ×2 IMPLANT
NEEDLE HYPO 21X1.5 SAFETY (NEEDLE) IMPLANT
NS IRRIG 1000ML POUR BTL (IV SOLUTION) ×2 IMPLANT
PACK GENERAL/GYN (CUSTOM PROCEDURE TRAY) ×2 IMPLANT
PACK ORTHO EXTREMITY (CUSTOM PROCEDURE TRAY) ×2 IMPLANT
PAD ABD 8X10 STRL (GAUZE/BANDAGES/DRESSINGS) ×2 IMPLANT
PAD ARMBOARD 7.5X6 YLW CONV (MISCELLANEOUS) ×4 IMPLANT
PAD CAST 4YDX4 CTTN HI CHSV (CAST SUPPLIES) ×1 IMPLANT
PADDING CAST COTTON 4X4 STRL (CAST SUPPLIES) ×1
PADDING CAST COTTON 6X4 STRL (CAST SUPPLIES) IMPLANT
PLATE 9H 184 LT MED DIST TIB (Plate) ×2 IMPLANT
SCREW CORT 3.5X40 (Screw) ×2 IMPLANT
SCREW CORT T15 TPR 55X3.5XST (Screw) ×1 IMPLANT
SCREW CORTICAL 3.5MM  28MM (Screw) ×3 IMPLANT
SCREW CORTICAL 3.5MM 28MM (Screw) ×3 IMPLANT
SCREW CORTICAL 3.5MM 36MM (Screw) ×2 IMPLANT
SCREW CORTICAL 3.5X55MM (Screw) ×1 IMPLANT
SCREW LOW PROFILE 3.5X48MM (Screw) ×4 IMPLANT
SCREW LP 3.5 (Screw) ×4 IMPLANT
SCREW LP 3.5X60MM (Screw) ×2 IMPLANT
SPONGE LAP 18X18 X RAY DECT (DISPOSABLE) ×2 IMPLANT
STAPLER VISISTAT 35W (STAPLE) IMPLANT
SUCTION FRAZIER HANDLE 10FR (MISCELLANEOUS) ×1
SUCTION TUBE FRAZIER 10FR DISP (MISCELLANEOUS) ×1 IMPLANT
SUT ETHILON 3 0 PS 1 (SUTURE) ×4 IMPLANT
SUT PDS AB 2-0 CT1 27 (SUTURE) IMPLANT
SUT VIC AB 2-0 CT1 27 (SUTURE) ×2
SUT VIC AB 2-0 CT1 TAPERPNT 27 (SUTURE) ×2 IMPLANT
SYR BULB IRRIGATION 50ML (SYRINGE) ×2 IMPLANT
TOWEL OR 17X24 6PK STRL BLUE (TOWEL DISPOSABLE) ×2 IMPLANT
TOWEL OR 17X26 10 PK STRL BLUE (TOWEL DISPOSABLE) ×4 IMPLANT
TUBE CONNECTING 12X1/4 (SUCTIONS) ×2 IMPLANT
UNDERPAD 30X30 (UNDERPADS AND DIAPERS) ×2 IMPLANT
WATER STERILE IRR 1000ML POUR (IV SOLUTION) ×2 IMPLANT

## 2016-12-16 NOTE — Anesthesia Preprocedure Evaluation (Signed)
Anesthesia Evaluation  Patient identified by MRN, date of birth, ID band Patient awake    Reviewed: Allergy & Precautions, NPO status , Patient's Chart, lab work & pertinent test results  History of Anesthesia Complications Negative for: history of anesthetic complications  Airway Mallampati: II  TM Distance: >3 FB  Positive for:  Tracheal deviation   Dental  (+) Poor Dentition,    Pulmonary COPD,  COPD inhaler, Current Smoker,    breath sounds clear to auscultation       Cardiovascular negative cardio ROS   Rhythm:Regular     Neuro/Psych negative neurological ROS  negative psych ROS   GI/Hepatic negative GI ROS, Neg liver ROS,   Endo/Other  negative endocrine ROS  Renal/GU negative Renal ROS     Musculoskeletal  (+) Arthritis , Left tibia and fibula fx   Abdominal   Peds  Hematology negative hematology ROS (+)   Anesthesia Other Findings   Reproductive/Obstetrics                             Anesthesia Physical Anesthesia Plan  ASA: II  Anesthesia Plan: General and Regional   Post-op Pain Management:    Induction: Intravenous  PONV Risk Score and Plan: 1 and Ondansetron and Dexamethasone  Airway Management Planned: Oral ETT  Additional Equipment: None  Intra-op Plan:   Post-operative Plan: Extubation in OR  Informed Consent: I have reviewed the patients History and Physical, chart, labs and discussed the procedure including the risks, benefits and alternatives for the proposed anesthesia with the patient or authorized representative who has indicated his/her understanding and acceptance.   Dental advisory given  Plan Discussed with: CRNA and Surgeon  Anesthesia Plan Comments:         Anesthesia Quick Evaluation

## 2016-12-16 NOTE — Anesthesia Procedure Notes (Signed)
Anesthesia Regional Block: Adductor canal block   Pre-Anesthetic Checklist: ,, timeout performed, Correct Patient, Correct Site, Correct Laterality, Correct Procedure, Correct Position, site marked, Risks and benefits discussed,  Surgical consent,  Pre-op evaluation,  At surgeon's request and post-op pain management  Laterality: Lower and Left  Prep: chloraprep       Needles:  Injection technique: Single-shot  Needle Type: Echogenic Stimulator Needle          Additional Needles:   Procedures: ultrasound guided,,,,,,,,  Narrative:  Start time: 12/16/2016 11:44 AM End time: 12/16/2016 11:59 AM Injection made incrementally with aspirations every 5 mL.  Performed by: Personally  Anesthesiologist: Pearce Littlefield  Additional Notes: H+P and labs reviewed, risks and benefits discussed with patient, procedure tolerated well without complications

## 2016-12-16 NOTE — Transfer of Care (Signed)
Immediate Anesthesia Transfer of Care Note  Patient: Oscar Martinez  Procedure(s) Performed: Procedure(s): OPEN REDUCTION INTERNAL FIXATION (ORIF) TIBIA/FIBULA FRACTURE (Left)  Patient Location: PACU  Anesthesia Type:GA combined with regional for post-op pain  Level of Consciousness: awake, alert , oriented and patient cooperative  Airway & Oxygen Therapy: Patient Spontanous Breathing and Patient connected to nasal cannula oxygen  Post-op Assessment: Report given to RN, Post -op Vital signs reviewed and stable and Patient moving all extremities  Post vital signs: Reviewed and stable  Last Vitals:  Vitals:   12/16/16 1210 12/16/16 1215  BP: 134/78 137/81  Pulse: 87 81  Resp: 17 18  Temp:    SpO2: 100% 99%    Last Pain:  Vitals:   12/16/16 0958  TempSrc:   PainSc: 8          Complications: No apparent anesthesia complications

## 2016-12-16 NOTE — Discharge Instructions (Addendum)
Orthopaedic Trauma Service Discharge Instructions   General Discharge Instructions  WEIGHT BEARING STATUS: Nonweightbearing Left leg  RANGE OF MOTION/ACTIVITY: ok to move toes and knees. You are in a splint so no ankle motion   Wound Care: do not remove splint. We will remove at follow up visit. Keep splint clean and dry   DVT/PE prophylaxis: lovenox x 14 days   Diet: as you were eating previously.  Can use over the counter stool softeners and bowel preparations, such as Miralax, to help with bowel movements.  Narcotics can be constipating.  Be sure to drink plenty of fluids  PAIN MEDICATION USE AND EXPECTATIONS  You have likely been given narcotic medications to help control your pain.  After a traumatic event that results in an fracture (broken bone) with or without surgery, it is ok to use narcotic pain medications to help control one's pain.  We understand that everyone responds to pain differently and each individual patient will be evaluated on a regular basis for the continued need for narcotic medications. Ideally, narcotic medication use should last no more than 6-8 weeks (coinciding with fracture healing).   As a patient it is your responsibility as well to monitor narcotic medication use and report the amount and frequency you use these medications when you come to your office visit.   We would also advise that if you are using narcotic medications, you should take a dose prior to therapy to maximize you participation.  IF YOU ARE ON NARCOTIC MEDICATIONS IT IS NOT PERMISSIBLE TO OPERATE A MOTOR VEHICLE (MOTORCYCLE/CAR/TRUCK/MOPED) OR HEAVY MACHINERY DO NOT MIX NARCOTICS WITH OTHER CNS (CENTRAL NERVOUS SYSTEM) DEPRESSANTS SUCH AS ALCOHOL   STOP SMOKING OR USING NICOTINE PRODUCTS!!!!  As discussed nicotine severely impairs your body's ability to heal surgical and traumatic wounds but also impairs bone healing.  Wounds and bone heal by forming microscopic blood vessels (angiogenesis)  and nicotine is a vasoconstrictor (essentially, shrinks blood vessels).  Therefore, if vasoconstriction occurs to these microscopic blood vessels they essentially disappear and are unable to deliver necessary nutrients to the healing tissue.  This is one modifiable factor that you can do to dramatically increase your chances of healing your injury.    (This means no smoking, no nicotine gum, patches, etc)  DO NOT USE NONSTEROIDAL ANTI-INFLAMMATORY DRUGS (NSAID'S)  Using products such as Advil (ibuprofen), Aleve (naproxen), Motrin (ibuprofen) for additional pain control during fracture healing can delay and/or prevent the healing response.  If you would like to take over the counter (OTC) medication, Tylenol (acetaminophen) is ok.  However, some narcotic medications that are given for pain control contain acetaminophen as well. Therefore, you should not exceed more than 4000 mg of tylenol in a day if you do not have liver disease.  Also note that there are Mach OTC medicines, such as cold medicines and allergy medicines that my contain tylenol as well.  If you have any questions about medications and/or interactions please ask your doctor/PA or your pharmacist.      ICE AND ELEVATE INJURED/OPERATIVE EXTREMITY  Using ice and elevating the injured extremity above your heart can help with swelling and pain control.  Icing in a pulsatile fashion, such as 20 minutes on and 20 minutes off, can be followed.    Do not place ice directly on skin. Make sure there is a barrier between to skin and the ice pack.    Using frozen items such as frozen peas works well as the conform nicely to  the are that needs to be iced.  USE AN ACE WRAP OR TED HOSE FOR SWELLING CONTROL  In addition to icing and elevation, Ace wraps or TED hose are used to help limit and resolve swelling.  It is recommended to use Ace wraps or TED hose until you are informed to stop.    When using Ace Wraps start the wrapping distally (farthest away  from the body) and wrap proximally (closer to the body)   Example: If you had surgery on your leg or thing and you do not have a splint on, start the ace wrap at the toes and work your way up to the thigh        If you had surgery on your upper extremity and do not have a splint on, start the ace wrap at your fingers and work your way up to the upper arm  IF YOU ARE IN A SPLINT OR CAST DO NOT REMOVE IT FOR ANY REASON   If your splint gets wet for any reason please contact the office immediately. You Frein shower in your splint or cast as long as you keep it dry.  This can be done by wrapping in a cast cover or garbage back (or similar)  Do Not stick any thing down your splint or cast such as pencils, money, or hangers to try and scratch yourself with.  If you feel itchy take benadryl as prescribed on the bottle for itching  IF YOU ARE IN A CAM BOOT (BLACK BOOT)  You Herbison remove boot periodically. Perform daily dressing changes as noted below.  Wash the liner of the boot regularly and wear a sock when wearing the boot. It is recommended that you sleep in the boot until told otherwise  CALL THE OFFICE WITH ANY QUESTIONS OR CONCERNS: [phone]

## 2016-12-16 NOTE — Progress Notes (Signed)
Mellody Dance PA notified that patient is refusing X-ray in PACU.

## 2016-12-16 NOTE — Anesthesia Procedure Notes (Signed)
Procedure Name: Intubation Date/Time: 12/16/2016 1:11 PM Performed by: Fransisca Kaufmann Pre-anesthesia Checklist: Patient identified, Emergency Drugs available, Suction available and Patient being monitored Patient Re-evaluated:Patient Re-evaluated prior to induction Oxygen Delivery Method: Circle System Utilized Preoxygenation: Pre-oxygenation with 100% oxygen Induction Type: IV induction Ventilation: Mask ventilation without difficulty Tube type: Oral Tube size: 7.5 mm Number of attempts: 1 Airway Equipment and Method: Stylet and Oral airway Placement Confirmation: ETT inserted through vocal cords under direct vision,  positive ETCO2 and breath sounds checked- equal and bilateral Secured at: 23 cm Tube secured with: Tape Dental Injury: Teeth and Oropharynx as per pre-operative assessment

## 2016-12-16 NOTE — Progress Notes (Signed)
Orthopedic Tech Progress Note Patient Details:  Oscar Martinez [birth year] [id number]  Ortho Devices Type of Ortho Device: Crutches Ortho Device/Splint Interventions: Ordered, Adjustment   Jennye Moccasin 12/16/2016, 5:58 PM

## 2016-12-16 NOTE — Anesthesia Procedure Notes (Signed)
Anesthesia Regional Block: Popliteal block   Pre-Anesthetic Checklist: ,, timeout performed, Correct Patient, Correct Site, Correct Laterality, Correct Procedure, Correct Position, site marked, Risks and benefits discussed,  Surgical consent,  Pre-op evaluation,  At surgeon's request and post-op pain management  Laterality: Lower and Left  Prep: chloraprep       Needles:  Injection technique: Single-shot  Needle Type: Echogenic Stimulator Needle          Additional Needles:   Procedures: ultrasound guided,,,,,,,,  Narrative:  Start time: 12/16/2016 11:44 AM End time: 12/16/2016 11:57 AM Injection made incrementally with aspirations every 5 mL.  Performed by: Personally  Anesthesiologist: Cailan Antonucci  Additional Notes: H+P and labs reviewed, risks and benefits discussed with patient, procedure tolerated well without complications

## 2016-12-16 NOTE — H&P (Signed)
Orthopaedic Trauma Service H&P/Consult     Patient ID: Oscar Martinez MRN: [id number] DOB/AGE: 41/29/1977 41 y.o.  Chief Complaint: Left tibial pilon s/p ex fix and IM rodding of fibula HPI:  Oscar Martinez is an 41 y.o. male. With work related pilon fracture when a log impacted his body through his head. Presents for definitive treatment with removal of fixator.   Past Medical History:  Diagnosis Date  . Arthritis   . Closed fracture of left distal tibia 11/27/2016  . COPD (chronic obstructive pulmonary disease) ([place])   . Nicotine dependence 11/28/2016    Past Surgical History:  Procedure Laterality Date  . EXTERNAL FIXATION LEG Left 11/27/2016   Procedure: EXTERNAL FIXATION POSSIBLE REPAIR OF LEFT TIBIA AND FIBULA FRACTURES;  Surgeon: Altamese Cruzville, MD;  Location: [place];  Service: Orthopedics;  Laterality: Left;  . TOOTH EXTRACTION      Family History  Problem Relation Age of Onset  . Breast cancer Mother   . Peripheral vascular disease Father    Social History:  reports that he has been smoking.  He has been smoking about 1.00 pack per day. He has never used smokeless tobacco. He reports that he does not drink alcohol or use drugs.  Allergies: No Known Allergies  Medications Prior to Admission  Medication Sig Dispense Refill  . albuterol (PROVENTIL HFA;VENTOLIN HFA) 108 (90 Base) MCG/ACT inhaler Inhale 2 puffs into the lungs every 6 (six) hours as needed for wheezing.    Marland Kitchen amoxicillin-clavulanate (AUGMENTIN) 875-125 MG tablet Take 1 tablet by mouth 2 (two) times daily.  0  . enoxaparin (LOVENOX) 40 MG/0.4ML injection Inject 0.4 mLs (40 mg total) into the skin daily. 21 Syringe 0  . gabapentin (NEURONTIN) 600 MG tablet Take 1 tablet (600 mg total) by mouth 3 (three) times daily. 90 tablet 0  . ibuprofen (ADVIL,MOTRIN) 200 MG tablet Take 800 mg by mouth every 8 (eight) hours as needed (for pain.).    Marland Kitchen methocarbamol (ROBAXIN) 500 MG tablet Take 1-2 tablets (500-1,000 mg total) by  mouth every 6 (six) hours as needed for muscle spasms. 70 tablet 0  . oxyCODONE-acetaminophen (PERCOCET) 10-325 MG tablet Take 1-2 tablets by mouth every 6 (six) hours as needed for pain. 60 tablet 0  . docusate sodium (COLACE) 100 MG capsule Take 1 capsule (100 mg total) by mouth 2 (two) times daily. (Patient not taking: Reported on 12/11/2016) 30 capsule 1  . oxyCODONE (OXY IR/ROXICODONE) 5 MG immediate release tablet Take 1-2 tablets (5-10 mg total) by mouth every 6 (six) hours as needed for breakthrough pain (give between percocet for breakthrough pain only). (Patient not taking: Reported on 12/11/2016) 45 tablet 0  . polyethylene glycol (MIRALAX / GLYCOLAX) packet Take 17 g by mouth daily. (Patient not taking: Reported on 12/11/2016) 14 each 0    Results for orders placed or performed during the hospital encounter of 12/16/16 (from the past 48 hour(s))  CBC WITH DIFFERENTIAL     Status: None   Collection Time: 12/16/16  9:22 AM  Result Value Ref Range   WBC 6.8 4.0 - 10.5 K/uL   RBC 5.08 4.22 - 5.81 MIL/uL   Hemoglobin 15.1 13.0 - 17.0 g/dL   HCT 43.8 39.0 - 52.0 %   MCV 86.2 78.0 - 100.0 fL   MCH 29.7 26.0 - 34.0 pg   MCHC 34.5 30.0 - 36.0 g/dL   RDW 12.8 11.5 - 15.5 %   Platelets 204 150 - 400 K/uL   Neutrophils Relative %  58 %   Neutro Abs 4.0 1.7 - 7.7 K/uL   Lymphocytes Relative 34 %   Lymphs Abs 2.3 0.7 - 4.0 K/uL   Monocytes Relative 4 %   Monocytes Absolute 0.3 0.1 - 1.0 K/uL   Eosinophils Relative 3 %   Eosinophils Absolute 0.2 0.0 - 0.7 K/uL   Basophils Relative 1 %   Basophils Absolute 0.1 0.0 - 0.1 K/uL  Comprehensive metabolic panel     Status: None   Collection Time: 12/16/16  9:22 AM  Result Value Ref Range   Sodium 136 135 - 145 mmol/L   Potassium 4.0 3.5 - 5.1 mmol/L   Chloride 102 101 - 111 mmol/L   CO2 27 22 - 32 mmol/L   Glucose, Bld 91 65 - 99 mg/dL   BUN 9 6 - 20 mg/dL   Creatinine, Ser 0.75 0.61 - 1.24 mg/dL   Calcium 9.5 8.9 - 10.3 mg/dL   Total  Protein 7.3 6.5 - 8.1 g/dL   Albumin 4.0 3.5 - 5.0 g/dL   AST 20 15 - 41 U/L   ALT 24 17 - 63 U/L   Alkaline Phosphatase 59 38 - 126 U/L   Total Bilirubin 0.6 0.3 - 1.2 mg/dL   GFR calc non Af Amer >60 >60 mL/min   GFR calc Af Amer >60 >60 mL/min    Comment: (NOTE) The eGFR has been calculated using the CKD EPI equation. This calculation has not been validated in all clinical situations. eGFR's persistently <60 mL/min signify possible Chronic Kidney Disease.    Anion gap 7 5 - 15  Protime-INR     Status: None   Collection Time: 12/16/16  9:22 AM  Result Value Ref Range   Prothrombin Time 12.5 11.4 - 15.2 seconds   INR 0.94   APTT     Status: None   Collection Time: 12/16/16  9:22 AM  Result Value Ref Range   aPTT 31 24 - 36 seconds  Urine rapid drug screen (hosp performed)     Status: Abnormal   Collection Time: 12/16/16 10:09 AM  Result Value Ref Range   Opiates POSITIVE (A) NONE DETECTED   Cocaine NONE DETECTED NONE DETECTED   Benzodiazepines NONE DETECTED NONE DETECTED   Amphetamines NONE DETECTED NONE DETECTED   Tetrahydrocannabinol NONE DETECTED NONE DETECTED   Barbiturates NONE DETECTED NONE DETECTED    Comment:        DRUG SCREEN FOR MEDICAL PURPOSES ONLY.  IF CONFIRMATION IS NEEDED FOR ANY PURPOSE, NOTIFY LAB WITHIN 5 DAYS.        LOWEST DETECTABLE LIMITS FOR URINE DRUG SCREEN Drug Class       Cutoff (ng/mL) Amphetamine      1000 Barbiturate      200 Benzodiazepine   774 Tricyclics       128 Opiates          300 Cocaine          300 THC              50   Urinalysis, Routine w reflex microscopic     Status: None   Collection Time: 12/16/16 10:09 AM  Result Value Ref Range   Color, Urine YELLOW YELLOW   APPearance CLEAR CLEAR   Specific Gravity, Urine 1.016 1.005 - 1.030   pH 6.0 5.0 - 8.0   Glucose, UA NEGATIVE NEGATIVE mg/dL   Hgb urine dipstick NEGATIVE NEGATIVE   Bilirubin Urine NEGATIVE NEGATIVE   Ketones, ur NEGATIVE NEGATIVE  mg/dL   Protein,  ur NEGATIVE NEGATIVE mg/dL   Nitrite NEGATIVE NEGATIVE   Leukocytes, UA NEGATIVE NEGATIVE   No results found.  ROS No recent fever, bleeding abnormalities, urologic dysfunction, GI problems, or weight gain.  Blood pressure 135/81, pulse 81, temperature 97.9 F (36.6 C), temperature source Oral, resp. rate 18, height 5' 9"  (1.753 m), weight 92.1 kg (203 lb), SpO2 98 %. Physical Exam RRR CTA S/NT/ND LLE Dressing intact, clean, dry  Edema/ swelling controlled  Sens: DPN, SPN, TN intact  Motor: EHL, FHL, and lessor toe ext and flex all intact grossly  Brisk cap refill, warm to touch    Assessment/Plan Left tibial pilon fracture s/p ex fix for ORIF and ex fix removal  I discussed with the patient the risks and benefits of surgery, including the possibility of infection, nerve injury, vessel injury, wound breakdown, arthritis, symptomatic hardware, DVT/ PE, loss of motion, malunion, nonunion, and need for further surgery among others.  He acknowledged these risks and wished to proceed.  Altamese Cayuga, MD [facility] [phone] (p)  12/16/2016, 11:28 AM

## 2016-12-17 ENCOUNTER — Encounter (HOSPITAL_COMMUNITY): Payer: Self-pay | Admitting: Orthopedic Surgery

## 2016-12-18 NOTE — Anesthesia Postprocedure Evaluation (Signed)
Anesthesia Post Note  Patient: Oscar Martinez  Procedure(s) Performed: Procedure(s) (LRB): OPEN REDUCTION INTERNAL FIXATION (ORIF) TIBIA/FIBULA FRACTURE (Left)     Patient location during evaluation: PACU Anesthesia Type: Regional Level of consciousness: awake and alert Pain management: pain level controlled Vital Signs Assessment: post-procedure vital signs reviewed and stable Respiratory status: spontaneous breathing, nonlabored ventilation, respiratory function stable and patient connected to nasal cannula oxygen Cardiovascular status: blood pressure returned to baseline and stable Postop Assessment: no signs of nausea or vomiting Anesthetic complications: no    Last Vitals:  Vitals:   12/16/16 1637 12/16/16 1659  BP: (!) 131/46 126/74  Pulse: 83 88  Resp: 14   Temp:    SpO2: 96% 97%    Last Pain:  Vitals:   12/16/16 1745  TempSrc:   PainSc: 4                  Benny Henrie,JAMES TERRILL

## 2016-12-22 DIAGNOSIS — R404 Transient alteration of awareness: Secondary | ICD-10-CM | POA: Diagnosis not present

## 2016-12-22 DIAGNOSIS — R531 Weakness: Secondary | ICD-10-CM | POA: Diagnosis not present

## 2016-12-22 DIAGNOSIS — R197 Diarrhea, unspecified: Secondary | ICD-10-CM | POA: Diagnosis not present

## 2017-01-01 NOTE — Op Note (Signed)
NAME:  Oscar Martinez, Oscar Martinez                       ACCOUNT NO.:  MEDICAL RECORD NO.:  [id number]  LOCATION:                                 FACILITY:  PHYSICIAN:  Doralee AlbinoMichael H. Carola FrostHandy, M.D. DATE OF BIRTH:  11/28/1975  DATE OF PROCEDURE:  12/16/2016 DATE OF DISCHARGE:                              OPERATIVE REPORT   PREOPERATIVE DIAGNOSES: 1. Left distal tibia pilon line fracture. 2. Retained external fixator.  POSTOPERATIVE DIAGNOSES: 1. Left distal tibia pilon line fracture. 2. Retained external fixator.  PROCEDURES: 1. Open reduction and internal fixation of left pilon fracture, tibia     only. 2. Removal of external fixator under anesthesia. 3. Debridement of external fixator pin sites.  SURGEON:  Myrene GalasMichael Dominic Mahaney, MD.  ASSISTANT:  Montez MoritaKeith Paul, PA-C.  ANESTHESIA:  General supplemented with regional nerve block.  COMPLICATIONS:  None.  TOURNIQUET:  None.  DISPOSITION:  To PACU.  CONDITION:  Stable.  BRIEF SUMMARY OF INDICATIONS FOR PROCEDURE:  Oscar Martinez is a 41 year old who sustained a crush injury to his left tibia resulting in a pilon fracture on August 9.  He was stabilized with intramedullary nailing of the fibula and external fixation.  Because of his significant smoking, the patient required several weeks for resolution of the soft tissue swelling to the degree that we could proceed with plate osteosynthesis of the tibia.  I did discuss with him the risks and benefits of plating including potential for infection, nerve injury, vessel injury, DVT, PE, loss of motion, arthritis, and others.  We also specifically discussed the elevation of these risks given his smoking.  He strongly wished to proceed with surgery.  BRIEF SUMMARY OF PROCEDURE:  The patient was given preoperative antibiotics and a regional nerve block and taken to the operating room where general anesthesia was induced.  A standard prep and drape was performed with chlorhexidine scrub and Betadine scrub and  paint.  The external fixator was removed because of the ulceration of the pin sites. Serial curettage was performed to debride these beginning at the skin working down to the subcutaneous tissue and muscle fascia into the near cortex of the bone, scraping out this cavity, irrigating them thoroughly.  This included a through and through wash of the calcaneus in addition to the tibia metatarsal pins.  Following this, fresh gloves and drape were applied and the pin sites isolated from the field.  The C- arm was brought in to identify the correct starting point for the incision, which was made distally to allow introduction of the plate. The plate was slid up along the medial cortex making an additional incision below the most distal of the proximal tibial pins and this allowed for delivery of the plate at the correct position on the lateral plane.  This was checked on the AP and lateral views and then fixation achieved distally and sequentially tightening conical screws, over drilling the near cortex to achieve compression, and appropriate translation and reduction of the shaft fragment.  We were able to watch this compress nicely and resulted in a near anatomic restoration of alignment on AP and lateral views.  Wounds were irrigated thoroughly  and then closed in a standard layered fashion with 2-0 Vicryl and 2-0 nylon. Sterile gently compressive dressing was applied and then a posterior and stirrup splint.  The patient was awakened from anesthesia and transported to the PACU in stable condition.  Montez Morita, PA-C, did assist me with the procedure including the closure.  PROGNOSIS:  Mr. Quintela will have ice, elevation, and avoid smoking because of the significant increase in infection and nonunion associated with this.  I will plan to see him back in the office in 14 days for removal of his sutures and transition into a CAM boot to begin range of motion.     Doralee Albino. Carola Frost,  M.D.     MHH/MEDQ  D:  12/31/2016  T:  01/01/2017  Job:  [id number]

## 2017-01-12 ENCOUNTER — Ambulatory Visit
Admission: RE | Admit: 2017-01-12 | Discharge: 2017-01-12 | Disposition: A | Discharge status: Home / Self Care | Payer: Worker's Compensation | Source: Ambulatory Visit | Attending: Orthopedic Surgery | Admitting: Orthopedic Surgery

## 2017-01-12 ENCOUNTER — Other Ambulatory Visit: Payer: Self-pay | Admitting: Orthopedic Surgery

## 2017-01-12 DIAGNOSIS — M79605 Pain in left leg: Secondary | ICD-10-CM

## 2017-01-12 DIAGNOSIS — R609 Edema, unspecified: Secondary | ICD-10-CM

## 2017-03-04 DIAGNOSIS — Z79899 Other long term (current) drug therapy: Secondary | ICD-10-CM | POA: Diagnosis not present

## 2017-03-11 DIAGNOSIS — Z79899 Other long term (current) drug therapy: Secondary | ICD-10-CM | POA: Diagnosis not present

## 2017-03-18 DIAGNOSIS — Z79899 Other long term (current) drug therapy: Secondary | ICD-10-CM | POA: Diagnosis not present

## 2017-03-25 DIAGNOSIS — Z79899 Other long term (current) drug therapy: Secondary | ICD-10-CM | POA: Diagnosis not present

## 2017-04-01 DIAGNOSIS — Z79899 Other long term (current) drug therapy: Secondary | ICD-10-CM | POA: Diagnosis not present

## 2017-04-16 DIAGNOSIS — Z79899 Other long term (current) drug therapy: Secondary | ICD-10-CM | POA: Diagnosis not present

## 2017-04-30 DIAGNOSIS — Z79899 Other long term (current) drug therapy: Secondary | ICD-10-CM | POA: Diagnosis not present

## 2017-06-01 DIAGNOSIS — R079 Chest pain, unspecified: Secondary | ICD-10-CM | POA: Diagnosis not present

## 2017-06-01 DIAGNOSIS — T50905A Adverse effect of unspecified drugs, medicaments and biological substances, initial encounter: Secondary | ICD-10-CM | POA: Diagnosis not present

## 2017-06-01 DIAGNOSIS — J209 Acute bronchitis, unspecified: Secondary | ICD-10-CM | POA: Diagnosis not present

## 2017-06-01 DIAGNOSIS — R112 Nausea with vomiting, unspecified: Secondary | ICD-10-CM | POA: Diagnosis not present

## 2017-06-01 DIAGNOSIS — R05 Cough: Secondary | ICD-10-CM | POA: Diagnosis not present

## 2017-06-11 DIAGNOSIS — Z79899 Other long term (current) drug therapy: Secondary | ICD-10-CM | POA: Diagnosis not present

## 2017-07-09 DIAGNOSIS — M25572 Pain in left ankle and joints of left foot: Secondary | ICD-10-CM | POA: Diagnosis not present

## 2017-08-25 DIAGNOSIS — M79641 Pain in right hand: Secondary | ICD-10-CM | POA: Diagnosis not present

## 2017-08-25 DIAGNOSIS — R2231 Localized swelling, mass and lump, right upper limb: Secondary | ICD-10-CM | POA: Diagnosis not present

## 2017-08-25 DIAGNOSIS — F17290 Nicotine dependence, other tobacco product, uncomplicated: Secondary | ICD-10-CM | POA: Diagnosis not present

## 2017-08-25 DIAGNOSIS — S60561A Insect bite (nonvenomous) of right hand, initial encounter: Secondary | ICD-10-CM | POA: Diagnosis not present

## 2017-09-08 DIAGNOSIS — M79605 Pain in left leg: Secondary | ICD-10-CM | POA: Diagnosis not present

## 2017-09-08 DIAGNOSIS — Z87891 Personal history of nicotine dependence: Secondary | ICD-10-CM | POA: Diagnosis not present

## 2017-09-08 DIAGNOSIS — S82402S Unspecified fracture of shaft of left fibula, sequela: Secondary | ICD-10-CM | POA: Diagnosis not present

## 2017-09-08 DIAGNOSIS — G44209 Tension-type headache, unspecified, not intractable: Secondary | ICD-10-CM | POA: Diagnosis not present

## 2017-09-08 DIAGNOSIS — S82202S Unspecified fracture of shaft of left tibia, sequela: Secondary | ICD-10-CM | POA: Diagnosis not present

## 2017-09-08 DIAGNOSIS — Z79899 Other long term (current) drug therapy: Secondary | ICD-10-CM | POA: Diagnosis not present

## 2017-09-08 DIAGNOSIS — F17200 Nicotine dependence, unspecified, uncomplicated: Secondary | ICD-10-CM | POA: Diagnosis not present

## 2017-09-13 DIAGNOSIS — G8929 Other chronic pain: Secondary | ICD-10-CM | POA: Diagnosis not present

## 2017-09-13 DIAGNOSIS — M79605 Pain in left leg: Secondary | ICD-10-CM | POA: Diagnosis not present

## 2017-09-13 DIAGNOSIS — S82402S Unspecified fracture of shaft of left fibula, sequela: Secondary | ICD-10-CM | POA: Diagnosis not present

## 2017-09-13 DIAGNOSIS — S82202S Unspecified fracture of shaft of left tibia, sequela: Secondary | ICD-10-CM | POA: Diagnosis not present

## 2017-09-28 DIAGNOSIS — S82202S Unspecified fracture of shaft of left tibia, sequela: Secondary | ICD-10-CM | POA: Diagnosis not present

## 2017-09-28 DIAGNOSIS — S82402S Unspecified fracture of shaft of left fibula, sequela: Secondary | ICD-10-CM | POA: Diagnosis not present

## 2017-09-28 DIAGNOSIS — Z79899 Other long term (current) drug therapy: Secondary | ICD-10-CM | POA: Diagnosis not present

## 2017-09-28 DIAGNOSIS — M79605 Pain in left leg: Secondary | ICD-10-CM | POA: Diagnosis not present

## 2017-09-28 DIAGNOSIS — G8929 Other chronic pain: Secondary | ICD-10-CM | POA: Diagnosis not present

## 2017-10-12 DIAGNOSIS — G8929 Other chronic pain: Secondary | ICD-10-CM | POA: Diagnosis not present

## 2017-10-12 DIAGNOSIS — M79605 Pain in left leg: Secondary | ICD-10-CM | POA: Diagnosis not present

## 2017-10-12 DIAGNOSIS — Z79899 Other long term (current) drug therapy: Secondary | ICD-10-CM | POA: Diagnosis not present

## 2017-10-12 DIAGNOSIS — S82402S Unspecified fracture of shaft of left fibula, sequela: Secondary | ICD-10-CM | POA: Diagnosis not present

## 2017-10-12 DIAGNOSIS — S82202S Unspecified fracture of shaft of left tibia, sequela: Secondary | ICD-10-CM | POA: Diagnosis not present

## 2017-11-10 DIAGNOSIS — R0602 Shortness of breath: Secondary | ICD-10-CM | POA: Diagnosis not present

## 2017-11-10 DIAGNOSIS — Z79899 Other long term (current) drug therapy: Secondary | ICD-10-CM | POA: Diagnosis not present

## 2017-11-10 DIAGNOSIS — Z87891 Personal history of nicotine dependence: Secondary | ICD-10-CM | POA: Diagnosis not present

## 2017-11-10 DIAGNOSIS — Z0000 Encounter for general adult medical examination without abnormal findings: Secondary | ICD-10-CM | POA: Diagnosis not present

## 2017-11-10 DIAGNOSIS — Z131 Encounter for screening for diabetes mellitus: Secondary | ICD-10-CM | POA: Diagnosis not present

## 2017-11-10 DIAGNOSIS — F17200 Nicotine dependence, unspecified, uncomplicated: Secondary | ICD-10-CM | POA: Diagnosis not present

## 2017-12-10 DIAGNOSIS — E782 Mixed hyperlipidemia: Secondary | ICD-10-CM | POA: Diagnosis not present

## 2017-12-10 DIAGNOSIS — G8929 Other chronic pain: Secondary | ICD-10-CM | POA: Diagnosis not present

## 2017-12-10 DIAGNOSIS — M25572 Pain in left ankle and joints of left foot: Secondary | ICD-10-CM | POA: Diagnosis not present

## 2017-12-10 DIAGNOSIS — Z79899 Other long term (current) drug therapy: Secondary | ICD-10-CM | POA: Diagnosis not present

## 2018-01-07 DIAGNOSIS — Z87891 Personal history of nicotine dependence: Secondary | ICD-10-CM | POA: Diagnosis not present

## 2018-01-07 DIAGNOSIS — M25572 Pain in left ankle and joints of left foot: Secondary | ICD-10-CM | POA: Diagnosis not present

## 2018-01-07 DIAGNOSIS — Z79899 Other long term (current) drug therapy: Secondary | ICD-10-CM | POA: Diagnosis not present

## 2018-01-07 DIAGNOSIS — G8929 Other chronic pain: Secondary | ICD-10-CM | POA: Diagnosis not present

## 2018-01-10 DIAGNOSIS — J40 Bronchitis, not specified as acute or chronic: Secondary | ICD-10-CM | POA: Diagnosis not present

## 2018-01-10 DIAGNOSIS — T24002A Burn of unspecified degree of unspecified site of left lower limb, except ankle and foot, initial encounter: Secondary | ICD-10-CM | POA: Diagnosis not present

## 2018-01-10 DIAGNOSIS — Z79899 Other long term (current) drug therapy: Secondary | ICD-10-CM | POA: Diagnosis not present

## 2018-01-10 DIAGNOSIS — T310 Burns involving less than 10% of body surface: Secondary | ICD-10-CM | POA: Diagnosis not present

## 2018-01-10 DIAGNOSIS — X088XXA Exposure to other specified smoke, fire and flames, initial encounter: Secondary | ICD-10-CM | POA: Diagnosis not present

## 2018-01-10 DIAGNOSIS — Z23 Encounter for immunization: Secondary | ICD-10-CM | POA: Diagnosis not present

## 2018-01-10 DIAGNOSIS — T24202A Burn of second degree of unspecified site of left lower limb, except ankle and foot, initial encounter: Secondary | ICD-10-CM | POA: Diagnosis not present

## 2018-01-13 IMAGING — CT CT HEAD W/O CM
3 of 7 series · 14 of 47 positions shown, 17 images · non-contrast
Comparison: None.

CLINICAL DATA: Posterior neck pain and headache after being hit in
the head with a log.

EXAM:
CT HEAD WITHOUT CONTRAST
CT CERVICAL SPINE WITHOUT CONTRAST
TECHNIQUE: Multidetector CT imaging of the head and cervical spine was
performed following the standard protocol without intravenous
contrast. Multiplanar CT image reconstructions of the cervical spine
were also generated.

[Series 7: head 3.0 mpr sag · sagittal · 0.36mm/px · 1 of 60 slices shown]
[im 30/60  brain]
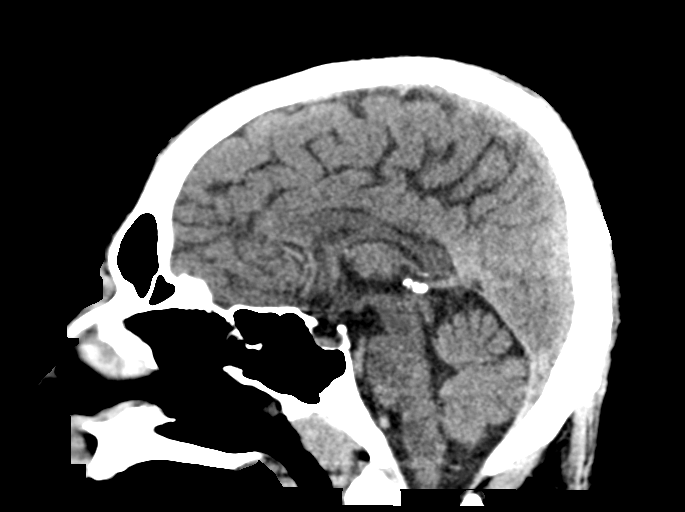

[Series 10: coronal bone · coronal · 0.27mm/px · 3 of 86 slices shown]
[im 32/86  brain]
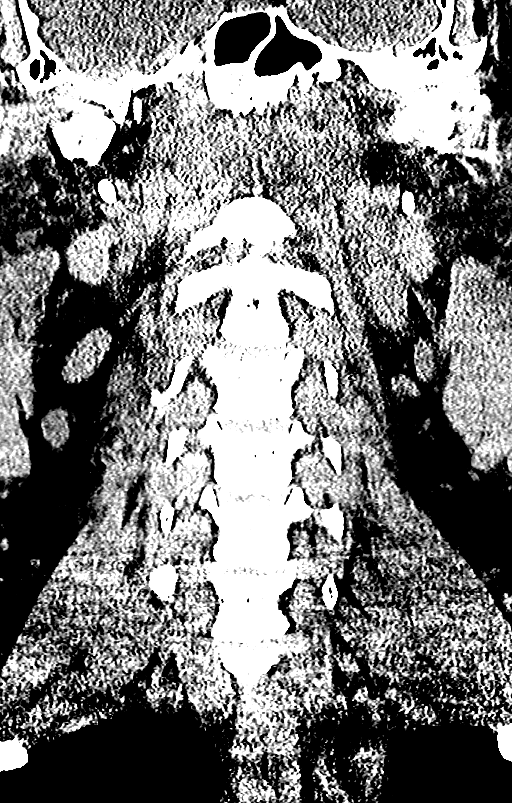
[im 43/86  brain]
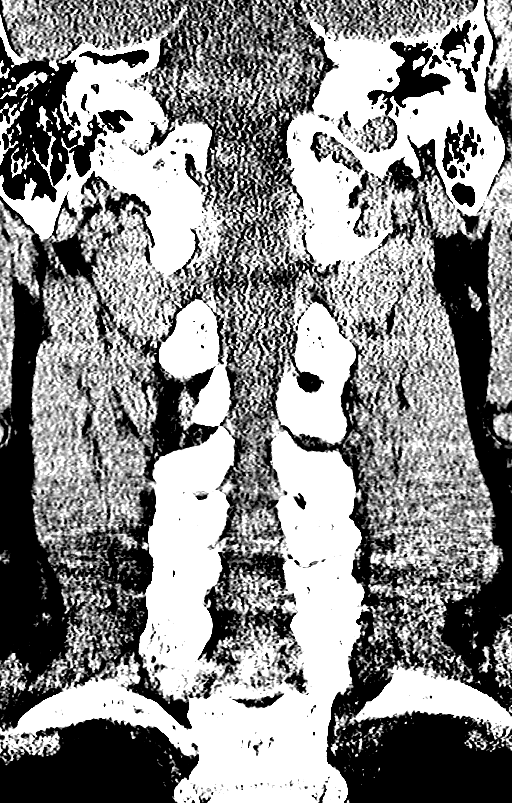
[im 54/86  brain]
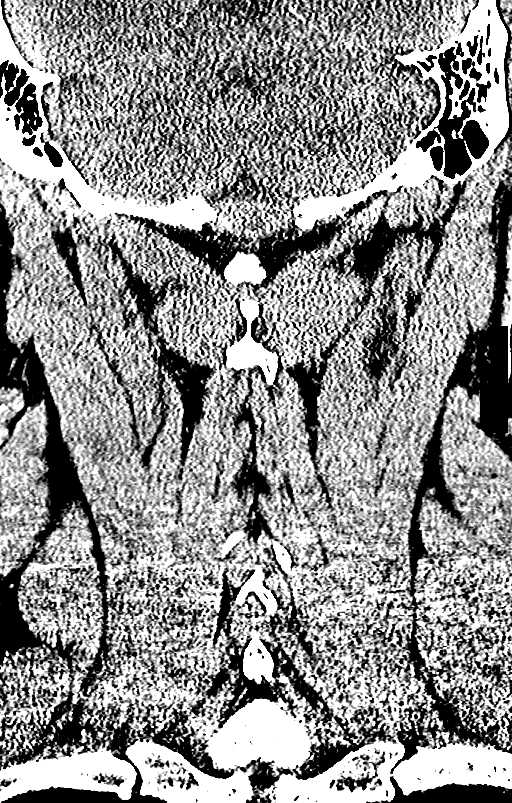

[Series 15: orthogonal axial st · axial · 0.21mm/px · z∈[-312,-159]mm · 10 of 96 slices shown, 13 images]
[im 9/96  brain]
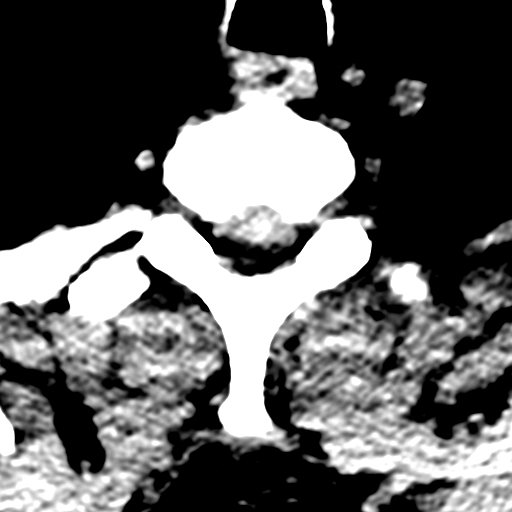
[im 9/96  bone]
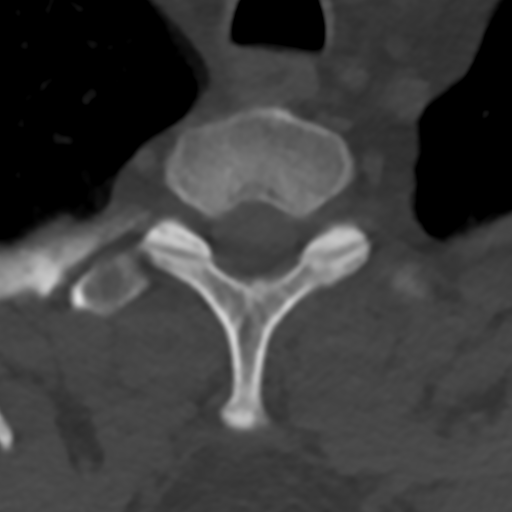
[im 18/96  brain]
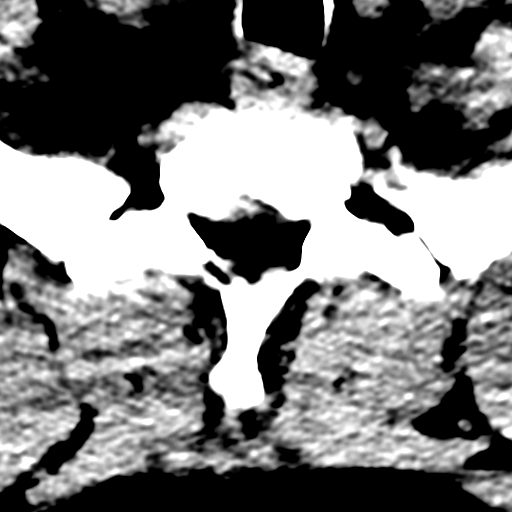
[im 26/96  brain]
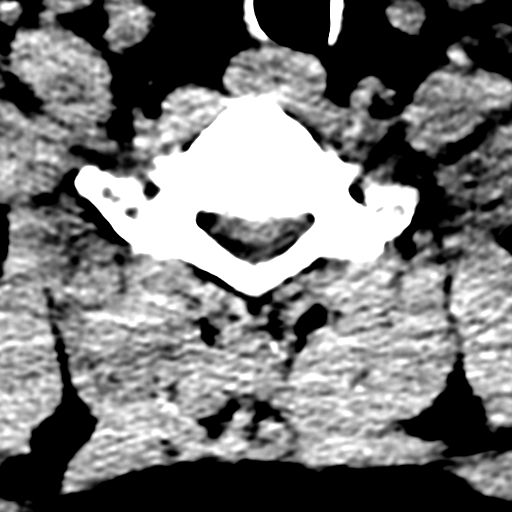
[im 35/96  brain]
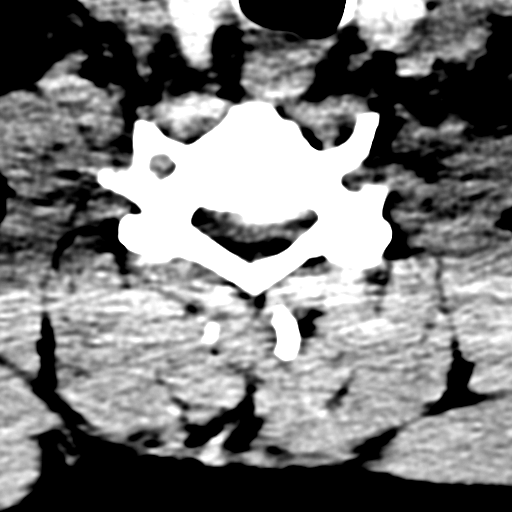
[im 44/96  brain]
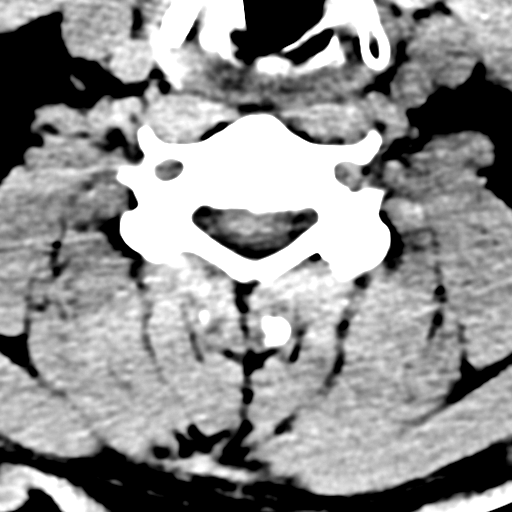
[im 44/96  bone]
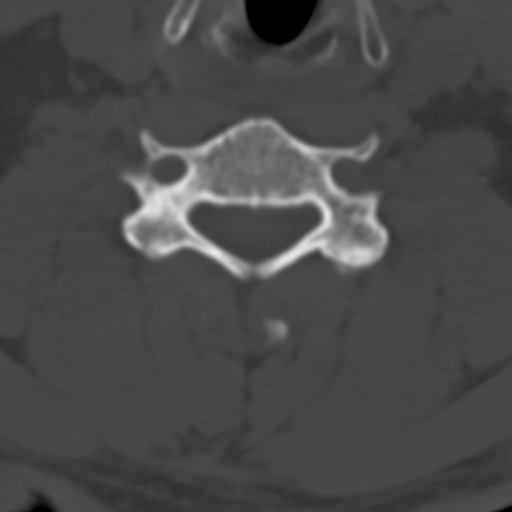
[im 52/96  brain]
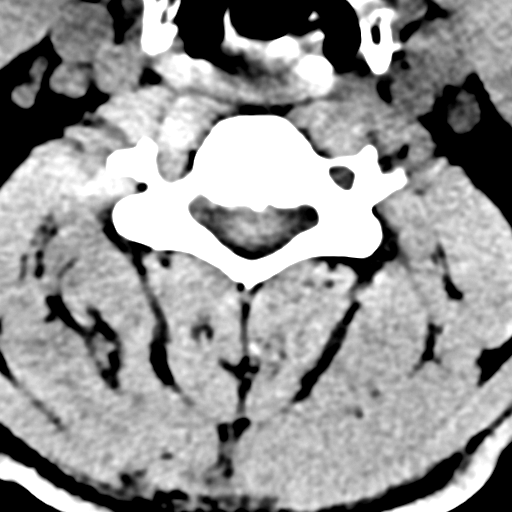
[im 61/96  brain]
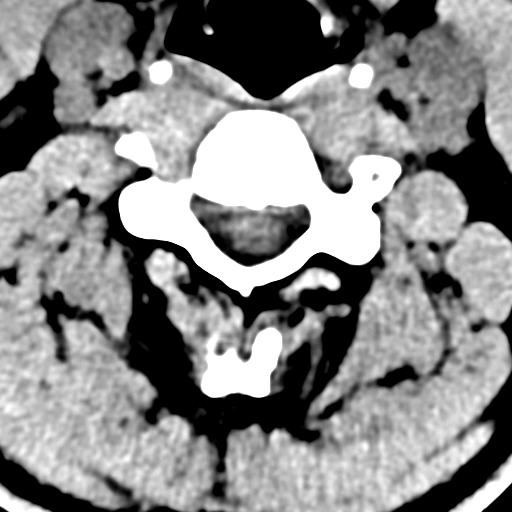
[im 70/96  brain]
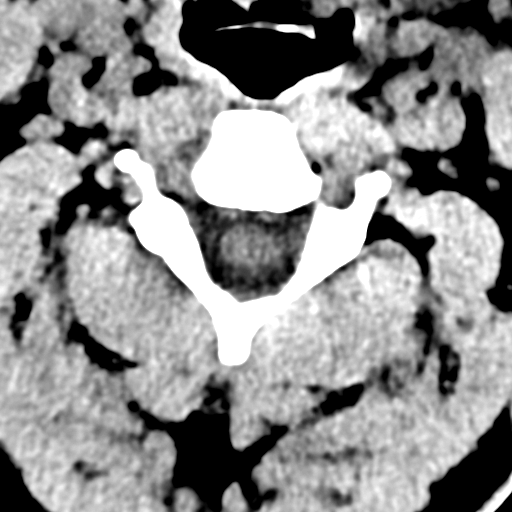
[im 78/96  brain]
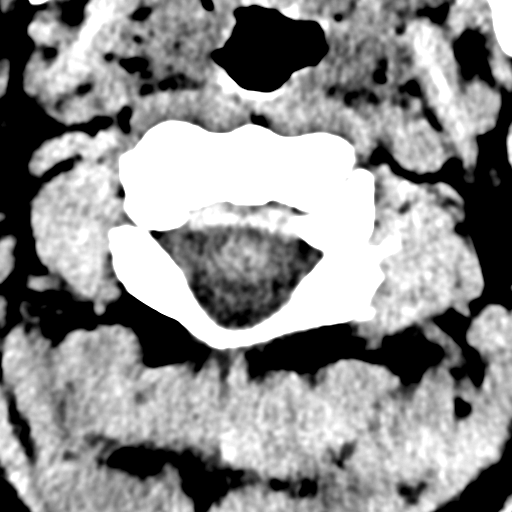
[im 78/96  bone]
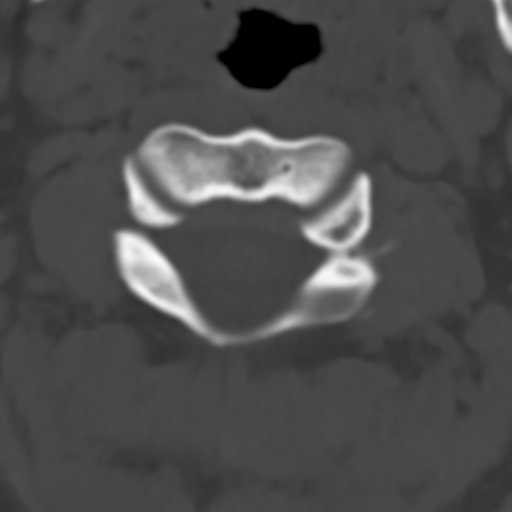
[im 87/96  brain]
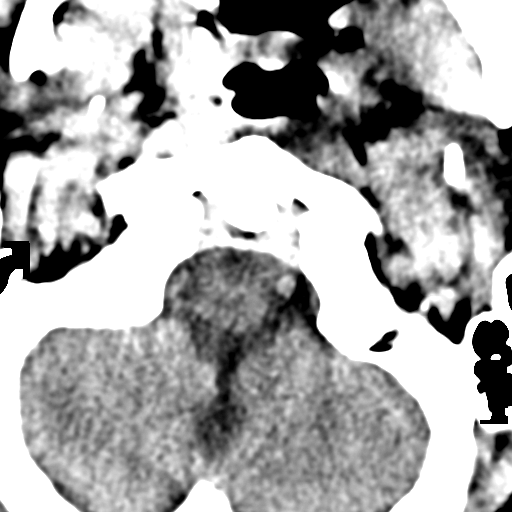

[14 of 47 positions shown; findings below may reference images not displayed]

FINDINGS: CT HEAD FINDINGS

Brain: No evidence of acute infarction, hemorrhage, hydrocephalus,
extra-axial collection or mass lesion/mass effect.

Vascular: No hyperdense vessel or unexpected calcification.

Skull: Normal. Negative for fracture or focal lesion.

Sinuses/Orbits: Moderate mucosal thickening in the right maxillary
sinus. Small mucous retention cysts in the bilateral sphenoid
sinuses. The remaining paranasal sinuses and mastoid air cells are
clear. The orbits are unremarkable.

Other: None.

CT CERVICAL SPINE FINDINGS

Alignment: Normal.

Skull base and vertebrae: No acute fracture. No primary bone lesion
or focal pathologic process.

Soft tissues and spinal canal: No prevertebral fluid or swelling. No
visible canal hematoma.

Disc levels:  Scattered minimal degenerative changes.

Upper chest: Negative.

Other: None.
IMPRESSION: 1.  No acute intracranial abnormality.
2.  No acute cervical spine fracture.

## 2018-01-13 IMAGING — CT CT ANKLE*L* W/O CM
3 series · 14 of 35 positions shown, 17 images · non-contrast
Comparison: Left ankle x-rays from same date.

CLINICAL DATA: Left ankle fracture.

EXAM:
CT OF THE LEFT ANKLE WITHOUT CONTRAST
TECHNIQUE: Multidetector CT imaging of the left ankle was performed according
to the standard protocol. Multiplanar CT image reconstructions were
also generated.

[Series 4: lower ext 1.5 st · axial · 0.38mm/px · z∈[-1375,-1195]mm · 6 of 157 slices shown, 8 images]
[im 25/157  soft-tissue]
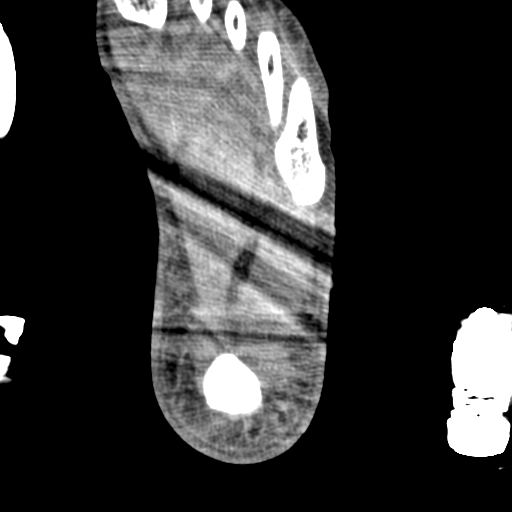
[im 25/157  bone]
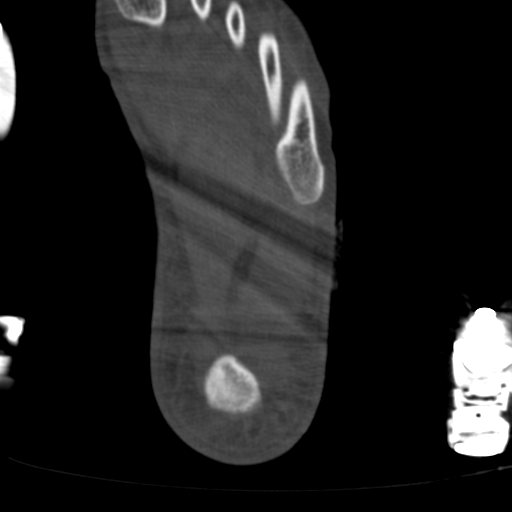
[im 49/157  bone]
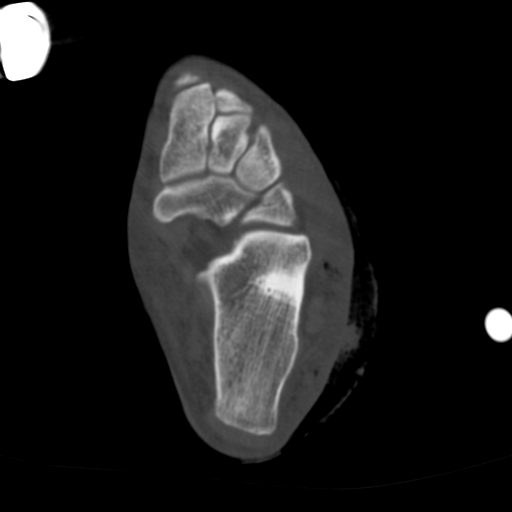
[im 73/157  bone]
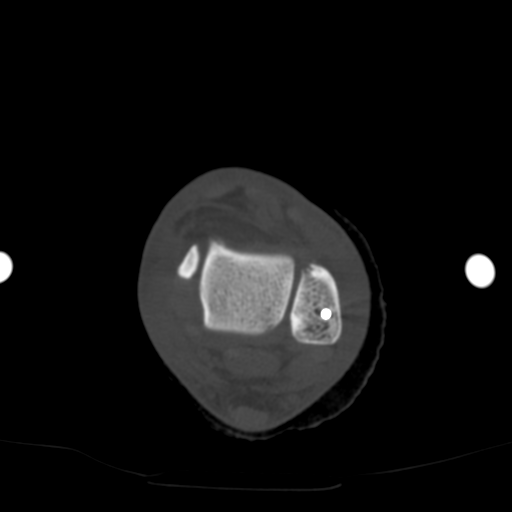
[im 97/157  bone]
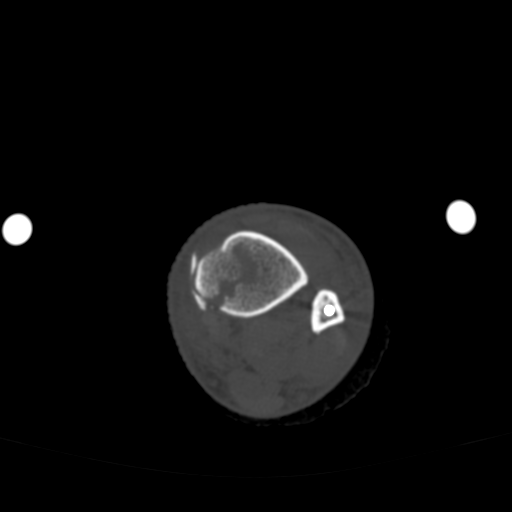
[im 121/157  soft-tissue]
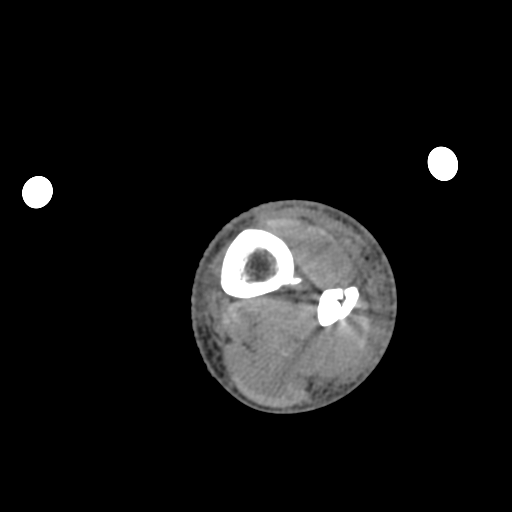
[im 121/157  bone]
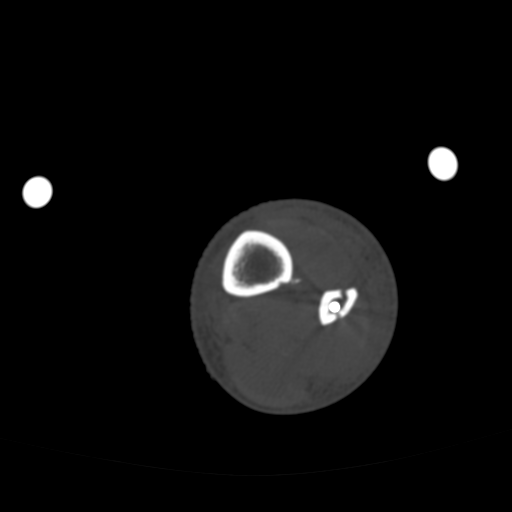
[im 145/157  bone]
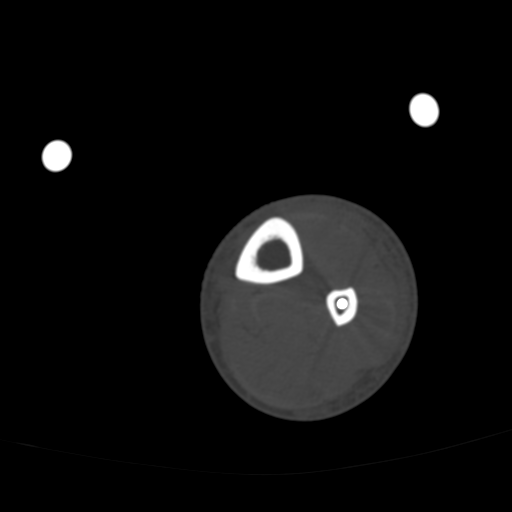

[Series 9: lower ext cor st · coronal · 0.27mm/px · 3 of 148 slices shown]
[im 30/148  bone]
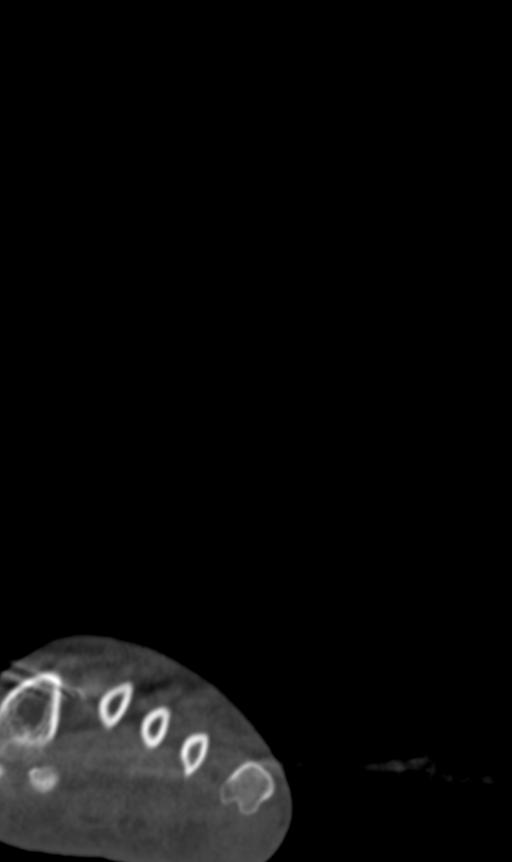
[im 59/148  bone]
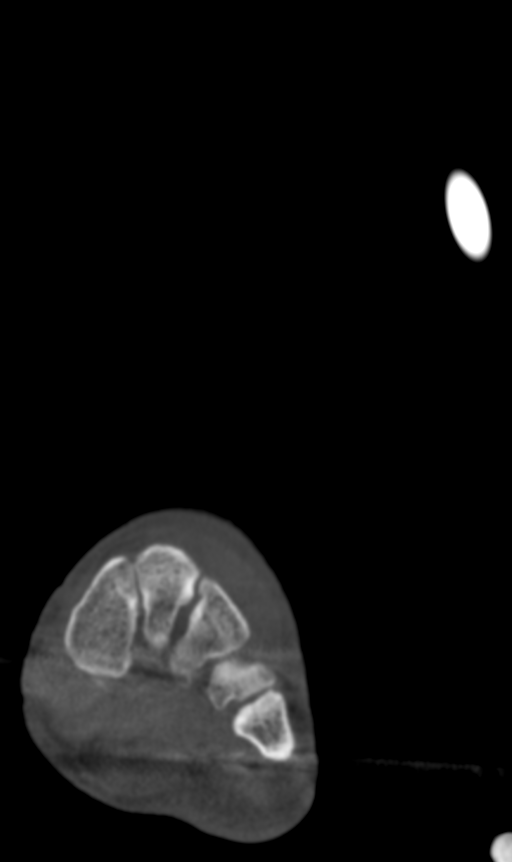
[im 89/148  bone]
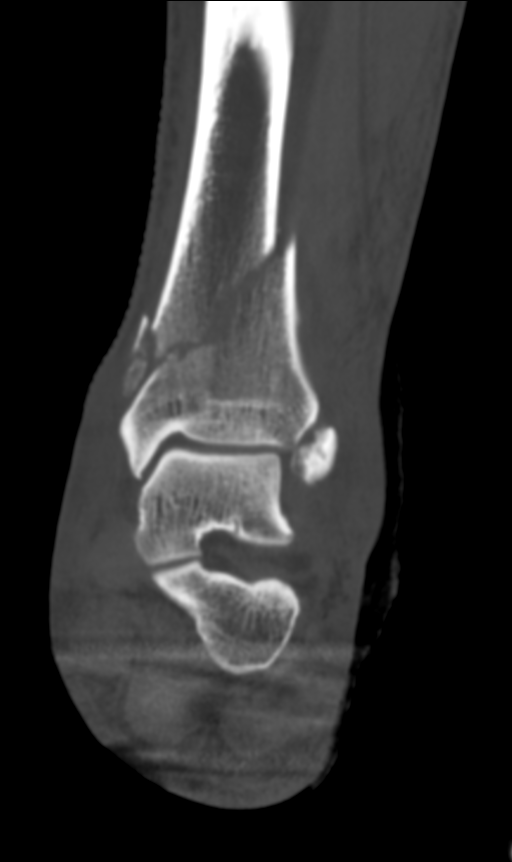

[Series 10: lower ext sag st · sagittal · 0.42mm/px · 5 of 87 slices shown, 6 images]
[im 29/87  bone]
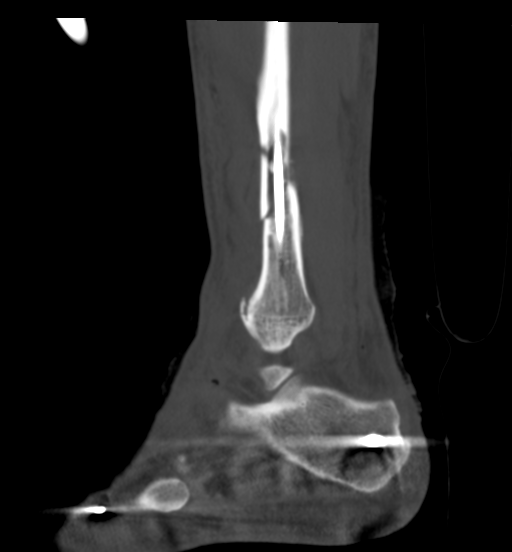
[im 36/87  bone]
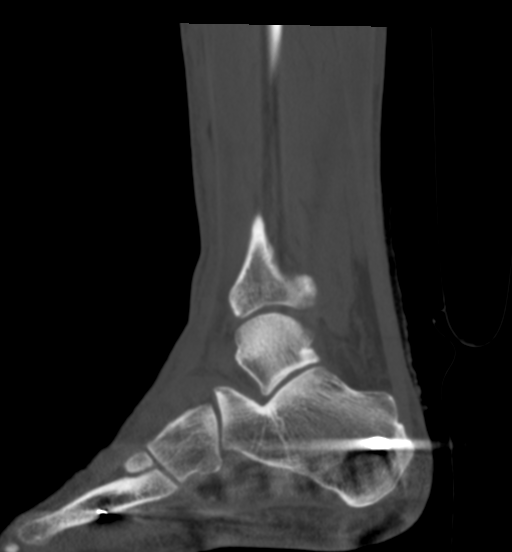
[im 44/87  soft-tissue]
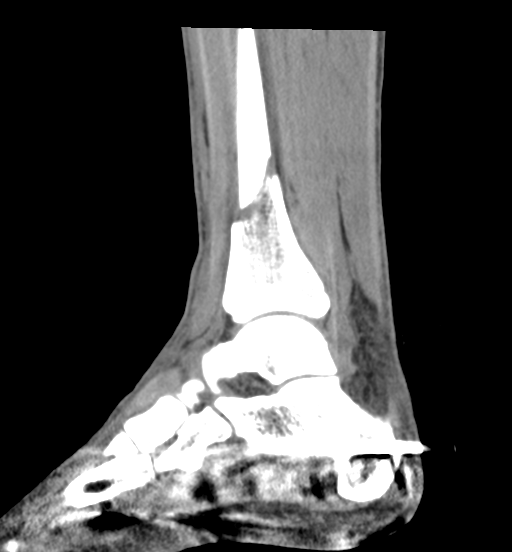
[im 44/87  bone]
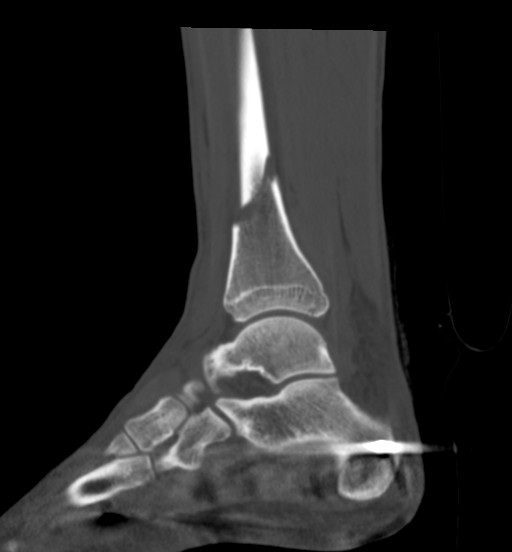
[im 51/87  bone]
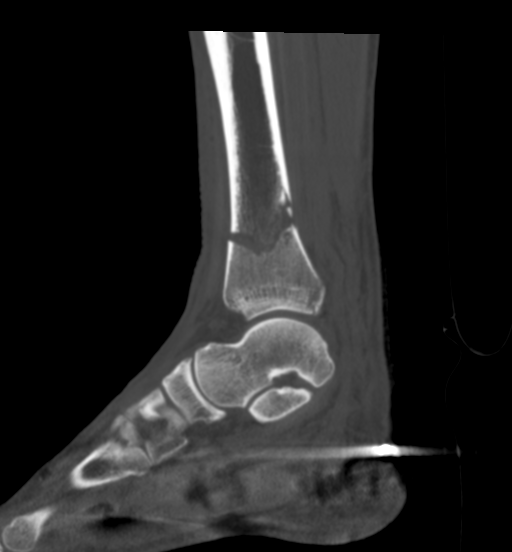
[im 58/87  bone]
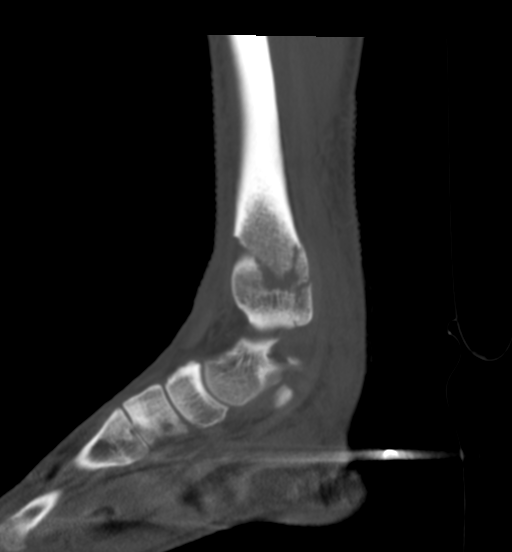

[14 of 35 positions shown; findings below may reference images not displayed]

FINDINGS: Bones/Joint/Cartilage

Again seen are comminuted, minimally displaced spiral fractures of
the distal tibia and fibula. The distal tibia fracture extends into
the medial malleolus, as well as the posterior malleolus and
tibiotalar joint. No additional fractures seen. The talar dome is
intact. The ankle mortise is relatively symmetric. Lisfranc
alignment is maintained. Small tibiotalar joint effusion.

Unchanged partially visualized K-wire in the fibula. Unchanged
external fixation screws through posterior calcaneus and first and
fifth metatarsals.

Ligaments

Suboptimally assessed by CT.

Muscles and Tendons

The visualized flexor, extensor, peroneal, and Achilles tendons are
intact.

Soft tissues

Diffuse soft tissue swelling about the ankle and lower leg.
IMPRESSION: 1. Comminuted, minimally displaced spiral fracture of the distal
tibia extending into the medial and posterior malleoli as described
above. Alignment is unchanged from prior x-rays.
2. Comminuted spiral fracture of the distal fibula in near anatomic
alignment status post K-wire fixation.

## 2018-01-30 DIAGNOSIS — K0889 Other specified disorders of teeth and supporting structures: Secondary | ICD-10-CM | POA: Diagnosis not present

## 2018-02-04 DIAGNOSIS — Z79899 Other long term (current) drug therapy: Secondary | ICD-10-CM | POA: Diagnosis not present

## 2018-02-04 DIAGNOSIS — G8929 Other chronic pain: Secondary | ICD-10-CM | POA: Diagnosis not present

## 2018-02-04 DIAGNOSIS — M545 Low back pain: Secondary | ICD-10-CM | POA: Diagnosis not present

## 2018-02-04 DIAGNOSIS — Z87891 Personal history of nicotine dependence: Secondary | ICD-10-CM | POA: Diagnosis not present

## 2018-02-13 DIAGNOSIS — H9209 Otalgia, unspecified ear: Secondary | ICD-10-CM | POA: Diagnosis not present

## 2018-02-13 DIAGNOSIS — Z79891 Long term (current) use of opiate analgesic: Secondary | ICD-10-CM | POA: Diagnosis not present

## 2018-02-13 DIAGNOSIS — F17200 Nicotine dependence, unspecified, uncomplicated: Secondary | ICD-10-CM | POA: Diagnosis not present

## 2018-02-13 DIAGNOSIS — R51 Headache: Secondary | ICD-10-CM | POA: Diagnosis not present

## 2018-02-13 DIAGNOSIS — J029 Acute pharyngitis, unspecified: Secondary | ICD-10-CM | POA: Diagnosis not present

## 2018-02-13 DIAGNOSIS — R0982 Postnasal drip: Secondary | ICD-10-CM | POA: Diagnosis not present

## 2018-02-13 DIAGNOSIS — Z79899 Other long term (current) drug therapy: Secondary | ICD-10-CM | POA: Diagnosis not present

## 2018-02-13 DIAGNOSIS — J3489 Other specified disorders of nose and nasal sinuses: Secondary | ICD-10-CM | POA: Diagnosis not present

## 2018-02-13 DIAGNOSIS — M542 Cervicalgia: Secondary | ICD-10-CM | POA: Diagnosis not present

## 2018-02-13 DIAGNOSIS — R062 Wheezing: Secondary | ICD-10-CM | POA: Diagnosis not present

## 2018-06-14 DIAGNOSIS — Z79899 Other long term (current) drug therapy: Secondary | ICD-10-CM | POA: Diagnosis not present

## 2018-06-21 DIAGNOSIS — Z79899 Other long term (current) drug therapy: Secondary | ICD-10-CM | POA: Diagnosis not present

## 2018-06-29 DIAGNOSIS — Z79899 Other long term (current) drug therapy: Secondary | ICD-10-CM | POA: Diagnosis not present

## 2018-09-27 IMAGING — US US EXTREM LOW VENOUS*L*
1 series · 13 of 24 positions shown · non-contrast
Comparison: None.

CLINICAL DATA: Left lower extremity pain. History of left ankle
fracture (11/27/2016. History of smoking. Evaluate for DVT.



[Series 1: us extrem low venous*left* · 0.07mm/px · 13 of 33 slices shown]
[im 1/33]
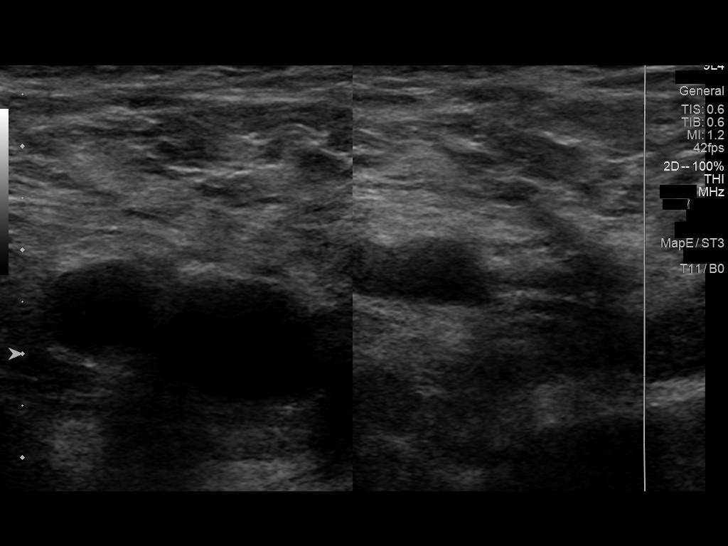
[im 3/33]
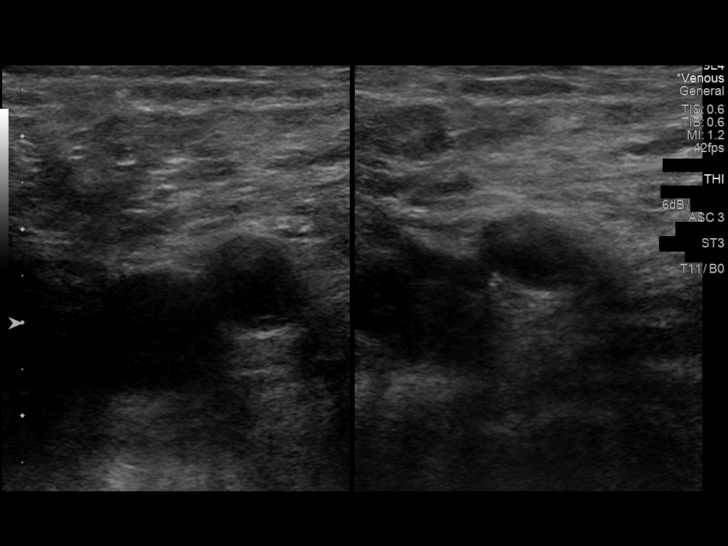
[im 6/33]
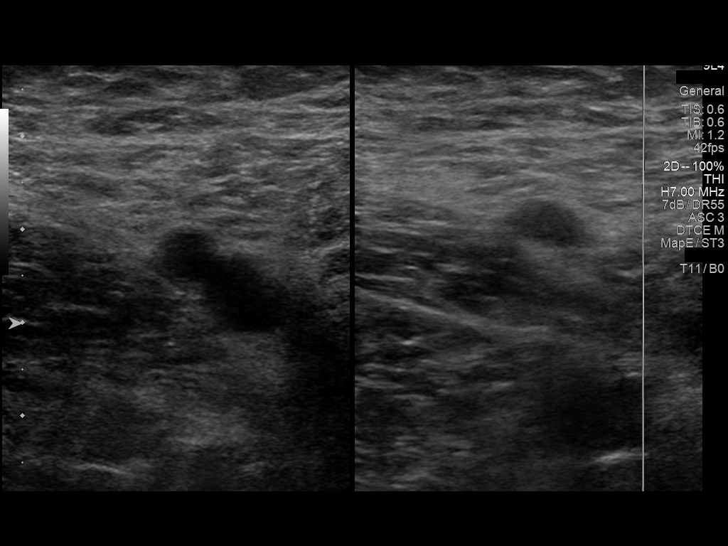
[im 9/33]
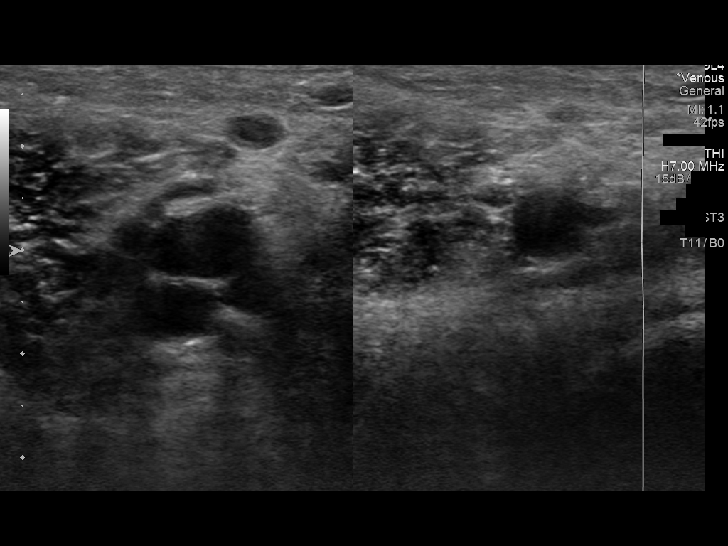
[im 12/33]
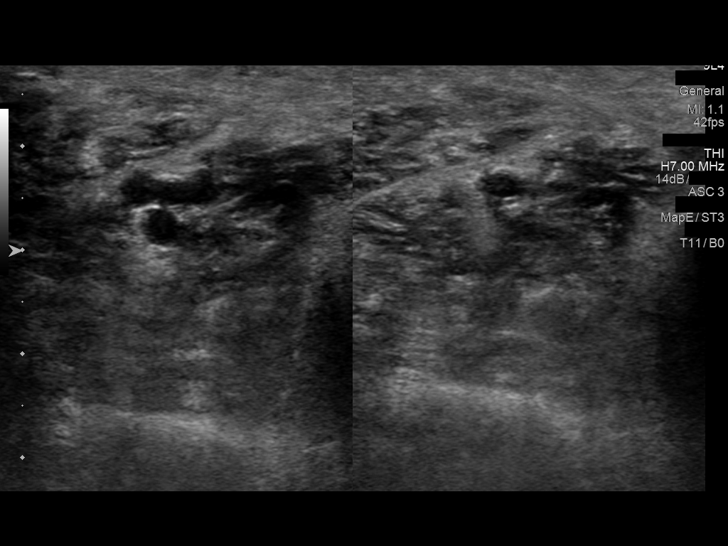
[im 14/33]
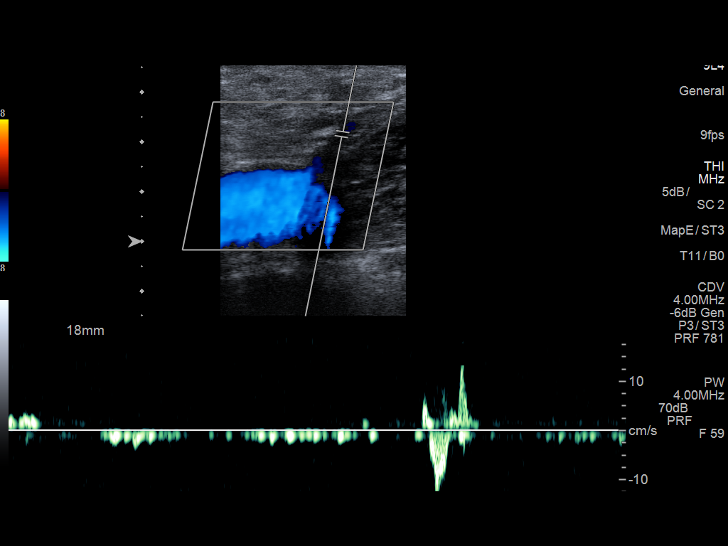
[im 17/33]
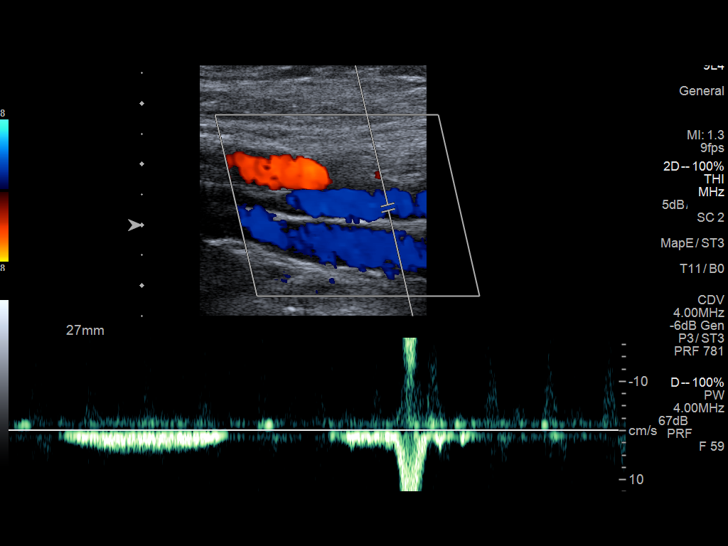
[im 19/33]
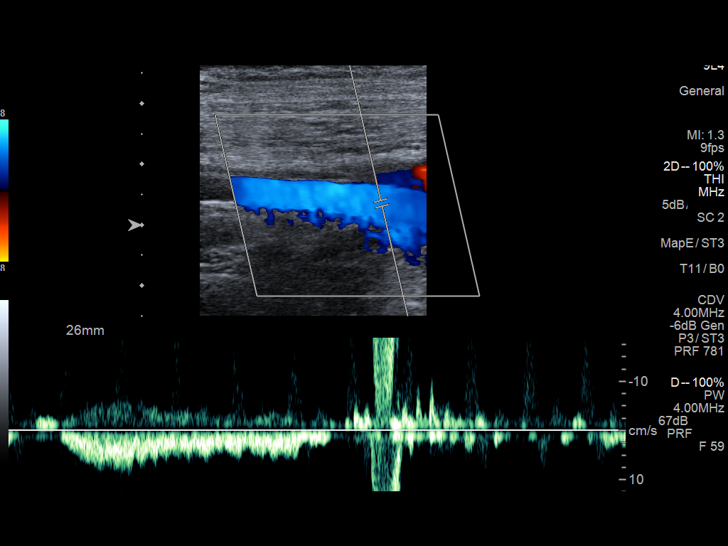
[im 21/33]
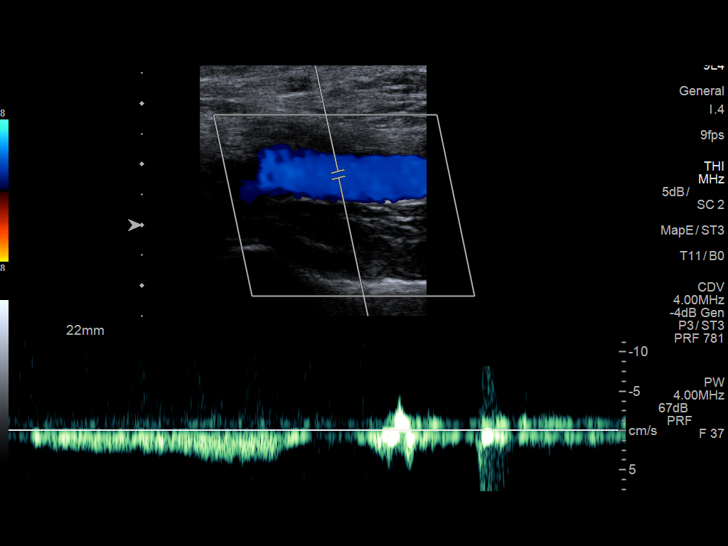
[im 24/33]
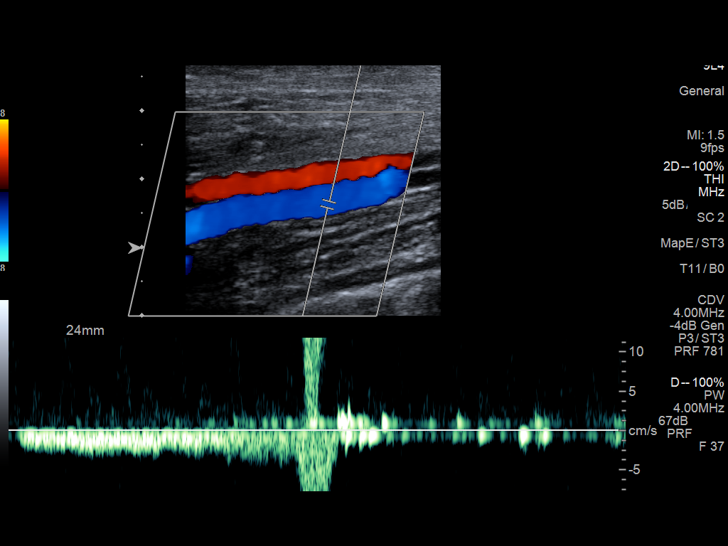
[im 27/33]
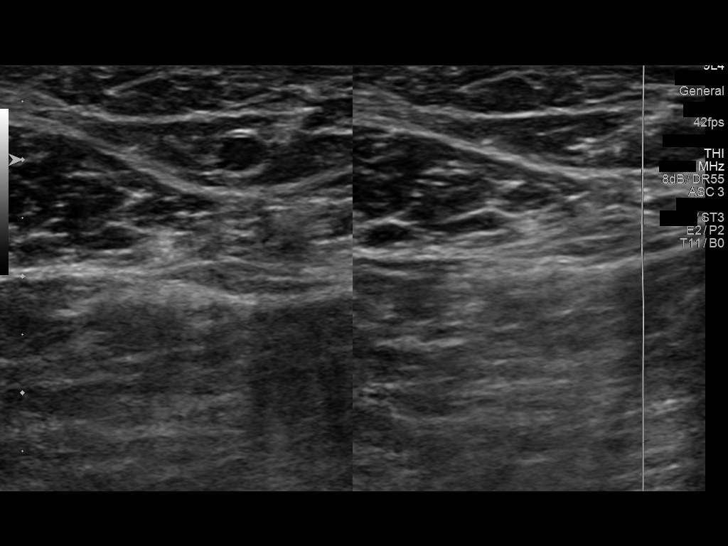
[im 30/33]
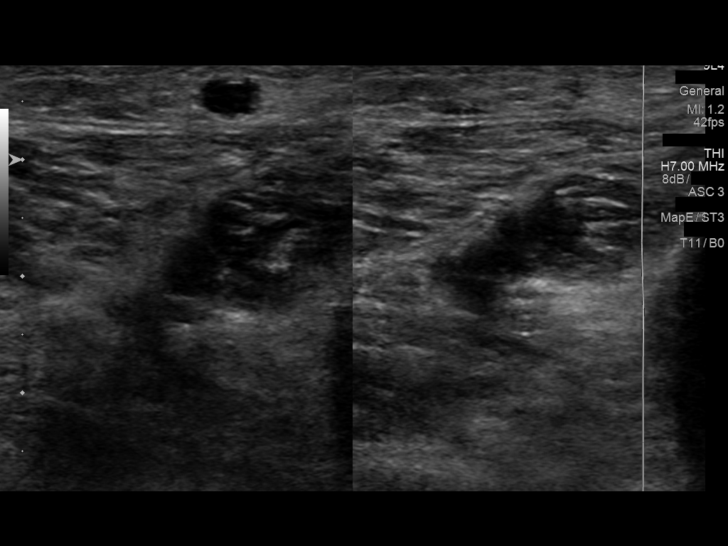
[im 33/33]
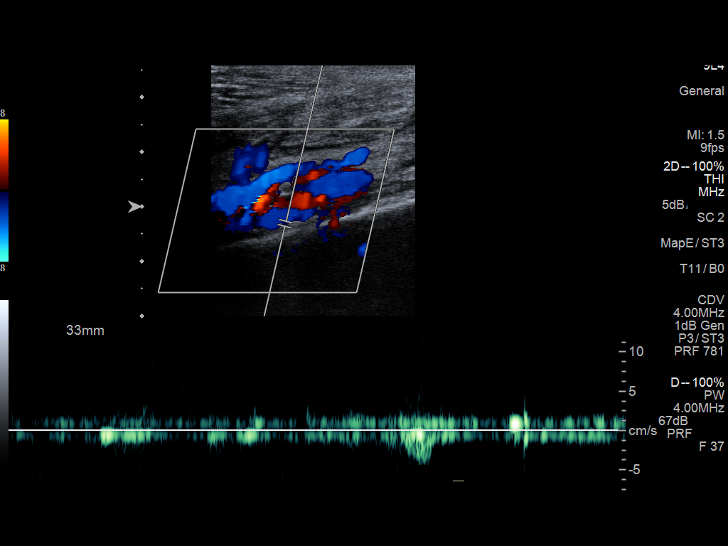

[13 of 24 positions shown; findings below may reference images not displayed]

FINDINGS: Contralateral Common Femoral Vein: Respiratory phasicity is normal
and symmetric with the symptomatic side. No evidence of thrombus.
Normal compressibility.

Common Femoral Vein: No evidence of thrombus. Normal
compressibility, respiratory phasicity and response to augmentation.

Saphenofemoral Junction: No evidence of thrombus. Normal
compressibility and flow on color Doppler imaging.

Profunda Femoral Vein: No evidence of thrombus. Normal
compressibility and flow on color Doppler imaging.

Femoral Vein: No evidence of thrombus. Normal compressibility,
respiratory phasicity and response to augmentation.

Popliteal Vein: No evidence of thrombus. Normal compressibility,
respiratory phasicity and response to augmentation.

Calf Veins: No evidence of thrombus. Normal compressibility and flow
on color Doppler imaging.

Superficial Great Saphenous Vein: No evidence of thrombus. Normal
compressibility and flow on color Doppler imaging.

Venous Reflux:  None.

Other Findings:  None.
IMPRESSION: No evidence of DVT within the left lower extremity.

## 2018-12-29 DIAGNOSIS — Z1339 Encounter for screening examination for other mental health and behavioral disorders: Secondary | ICD-10-CM | POA: Diagnosis not present

## 2018-12-29 DIAGNOSIS — Z1331 Encounter for screening for depression: Secondary | ICD-10-CM | POA: Diagnosis not present

## 2018-12-29 DIAGNOSIS — F17210 Nicotine dependence, cigarettes, uncomplicated: Secondary | ICD-10-CM | POA: Diagnosis not present

## 2018-12-29 DIAGNOSIS — Z79899 Other long term (current) drug therapy: Secondary | ICD-10-CM | POA: Diagnosis not present

## 2018-12-29 DIAGNOSIS — Z131 Encounter for screening for diabetes mellitus: Secondary | ICD-10-CM | POA: Diagnosis not present

## 2018-12-29 DIAGNOSIS — Z1159 Encounter for screening for other viral diseases: Secondary | ICD-10-CM | POA: Diagnosis not present

## 2018-12-29 DIAGNOSIS — Z0000 Encounter for general adult medical examination without abnormal findings: Secondary | ICD-10-CM | POA: Diagnosis not present

## 2018-12-29 DIAGNOSIS — R0602 Shortness of breath: Secondary | ICD-10-CM | POA: Diagnosis not present

## 2019-01-05 DIAGNOSIS — S82202S Unspecified fracture of shaft of left tibia, sequela: Secondary | ICD-10-CM | POA: Diagnosis not present

## 2019-01-05 DIAGNOSIS — F17210 Nicotine dependence, cigarettes, uncomplicated: Secondary | ICD-10-CM | POA: Diagnosis not present

## 2019-01-05 DIAGNOSIS — E559 Vitamin D deficiency, unspecified: Secondary | ICD-10-CM | POA: Diagnosis not present

## 2019-01-05 DIAGNOSIS — S82402S Unspecified fracture of shaft of left fibula, sequela: Secondary | ICD-10-CM | POA: Diagnosis not present

## 2019-01-05 DIAGNOSIS — M79604 Pain in right leg: Secondary | ICD-10-CM | POA: Diagnosis not present

## 2019-01-05 DIAGNOSIS — F1120 Opioid dependence, uncomplicated: Secondary | ICD-10-CM | POA: Diagnosis not present

## 2019-01-17 DIAGNOSIS — Z2821 Immunization not carried out because of patient refusal: Secondary | ICD-10-CM | POA: Diagnosis not present

## 2019-01-17 DIAGNOSIS — Z6828 Body mass index (BMI) 28.0-28.9, adult: Secondary | ICD-10-CM | POA: Diagnosis not present

## 2019-01-17 DIAGNOSIS — M79604 Pain in right leg: Secondary | ICD-10-CM | POA: Diagnosis not present

## 2019-01-17 DIAGNOSIS — E559 Vitamin D deficiency, unspecified: Secondary | ICD-10-CM | POA: Diagnosis not present
